# Patient Record
Sex: Female | Born: 1945
Health system: Southern US, Community
[De-identification: ages and names within clinical notes are randomized; demographics above are authoritative.]

## PROBLEM LIST (undated history)

## (undated) DIAGNOSIS — M810 Age-related osteoporosis without current pathological fracture: Secondary | ICD-10-CM

## (undated) HISTORY — DX: Age-related osteoporosis without current pathological fracture: M81.0

## (undated) HISTORY — PX: TONSILLECTOMY: SUR1361

---

## 2014-01-04 DIAGNOSIS — H3530 Unspecified macular degeneration: Secondary | ICD-10-CM | POA: Diagnosis not present

## 2014-01-04 DIAGNOSIS — H43319 Vitreous membranes and strands, unspecified eye: Secondary | ICD-10-CM | POA: Diagnosis not present

## 2014-01-04 DIAGNOSIS — D3130 Benign neoplasm of unspecified choroid: Secondary | ICD-10-CM | POA: Diagnosis not present

## 2014-05-28 DIAGNOSIS — Z23 Encounter for immunization: Secondary | ICD-10-CM | POA: Diagnosis not present

## 2015-03-26 DIAGNOSIS — R350 Frequency of micturition: Secondary | ICD-10-CM | POA: Diagnosis not present

## 2015-03-26 DIAGNOSIS — I10 Essential (primary) hypertension: Secondary | ICD-10-CM | POA: Diagnosis not present

## 2015-03-26 DIAGNOSIS — N760 Acute vaginitis: Secondary | ICD-10-CM | POA: Diagnosis not present

## 2015-03-31 ENCOUNTER — Encounter: Payer: Self-pay | Admitting: *Deleted

## 2015-03-31 ENCOUNTER — Emergency Department (INDEPENDENT_AMBULATORY_CARE_PROVIDER_SITE_OTHER)
Admission: EM | Admit: 2015-03-31 | Discharge: 2015-03-31 | Disposition: A | Discharge status: Home / Self Care | Payer: Medicare Other | Source: Home / Self Care | Attending: Family Medicine | Admitting: Family Medicine

## 2015-03-31 DIAGNOSIS — R350 Frequency of micturition: Secondary | ICD-10-CM | POA: Diagnosis not present

## 2015-03-31 DIAGNOSIS — N3090 Cystitis, unspecified without hematuria: Secondary | ICD-10-CM | POA: Diagnosis not present

## 2015-03-31 LAB — POCT URINALYSIS DIP (MANUAL ENTRY)
Glucose, UA: NEGATIVE
NITRITE UA: NEGATIVE
PH UA: 5 (ref 5–8)
Spec Grav, UA: >=1.03 (ref 1.005–1.03)
UROBILINOGEN UA: 0.2 (ref 0–1)

## 2015-03-31 MED ORDER — SULFAMETHOXAZOLE-TRIMETHOPRIM 800-160 MG PO TABS: 1.0000 | ORAL_TABLET | Freq: Two times a day (BID) | ORAL | Status: DC

## 2015-03-31 NOTE — Discharge Instructions (Signed)
Increase fluid intake.  May use non-prescription AZO for about two days, if desired, to decrease urinary discomfort.  If symptoms become significantly worse during the night or over the weekend, proceed to the local emergency room.    Urinary Tract Infection Urinary tract infections (UTIs) can develop anywhere along your urinary tract. Your urinary tract is your body's drainage system for removing wastes and extra water. Your urinary tract includes two kidneys, two ureters, a bladder, and a urethra. Your kidneys are a pair of bean-shaped organs. Each kidney is about the size of your fist. They are located below your ribs, one on each side of your spine. CAUSES Infections are caused by microbes, which are microscopic organisms, including fungi, viruses, and bacteria. These organisms are so small that they can only be seen through a microscope. Bacteria are the microbes that most commonly cause UTIs. SYMPTOMS  Symptoms of UTIs may vary by age and gender of the patient and by the location of the infection. Symptoms in young women typically include a frequent and intense urge to urinate and a painful, burning feeling in the bladder or urethra during urination. Older women and men are more likely to be tired, shaky, and weak and have muscle aches and abdominal pain. A fever may mean the infection is in your kidneys. Other symptoms of a kidney infection include pain in your back or sides below the ribs, nausea, and vomiting. DIAGNOSIS To diagnose a UTI, your caregiver will ask you about your symptoms. Your caregiver also will ask to provide a urine sample. The urine sample will be tested for bacteria and white blood cells. White blood cells are made by your body to help fight infection. TREATMENT  Typically, UTIs can be treated with medication. Because most UTIs are caused by a bacterial infection, they usually can be treated with the use of antibiotics. The choice of antibiotic and length of treatment depend  on your symptoms and the type of bacteria causing your infection. HOME CARE INSTRUCTIONS  If you were prescribed antibiotics, take them exactly as your caregiver instructs you. Finish the medication even if you feel better after you have only taken some of the medication.  Drink enough water and fluids to keep your urine clear or pale yellow.  Avoid caffeine, tea, and carbonated beverages. They tend to irritate your bladder.  Empty your bladder often. Avoid holding urine for long periods of time.  Empty your bladder before and after sexual intercourse.  After a bowel movement, women should cleanse from front to back. Use each tissue only once. SEEK MEDICAL CARE IF:   You have back pain.  You develop a fever.  Your symptoms do not begin to resolve within 3 days. SEEK IMMEDIATE MEDICAL CARE IF:   You have severe back pain or lower abdominal pain.  You develop chills.  You have nausea or vomiting.  You have continued burning or discomfort with urination. MAKE SURE YOU:   Understand these instructions.  Will watch your condition.  Will get help right away if you are not doing well or get worse. Document Released: 04/28/2005 Document Revised: 01/18/2012 Document Reviewed: 08/27/2011 [facility] Patient Information 2015 [place], [place]. This information is not intended to replace advice given to you by your health care provider. Make sure you discuss any questions you have with your health care provider.

## 2015-03-31 NOTE — ED Notes (Signed)
Pt c/o urinary frequency, pressure, and odor x 03/23/15. Was seen at [facility] 03/26/15 dx with vaginitis and given Flagyl with no relief. Denies fever.

## 2015-03-31 NOTE — ED Provider Notes (Signed)
CSN: [id number]     Arrival date & time 03/31/15  1737 History   First MD Initiated Contact with Patient 03/31/15 1759     Chief Complaint  Patient presents with  . Urinary Frequency      HPI Comments: Patient complains of onset of lower abdominal pressure and nocturia about 1.5 weeks ago.  She was seen at an urgent care clinic where a urinalysis was negative, and she was treated for a vaginitis with metronidazole.  She has had no improvement, and has now developed dysuria and hesitancy during the past two days.  No abdominal or pelvic pain.  No fevers, chills, and sweats.  No nausea/vomiting. She reports that she had take amoxicillin about 2.5 weeks ago.  Patient is a 69 y.o. female presenting with dysuria. The history is provided by the patient.  Dysuria Pain quality:  Burning Pain severity:  Mild Onset quality:  Gradual Duration:  2 days Timing:  Constant Progression:  Worsening Chronicity:  New Recent urinary tract infections: no   Relieved by:  Phenazopyridine Worsened by:  Nothing tried Ineffective treatments: Flagyl. Urinary symptoms: frequent urination and hesitancy   Urinary symptoms: no discolored urine, no foul-smelling urine, no hematuria and no bladder incontinence   Associated symptoms: no abdominal pain, no fever, no flank pain, no nausea and no vomiting   Risk factors: no recurrent urinary tract infections     History reviewed. No pertinent past medical history. Past Surgical History  Procedure Laterality Date  . Tonsillectomy     Family History  Problem Relation Age of Onset  . Kidney Stones Mother   . Cancer Father    Social History  Substance Use Topics  . Smoking status: Never Smoker   . Smokeless tobacco: None  . Alcohol Use: No   OB History    No data available     Review of Systems  Constitutional: Negative for fever.  Gastrointestinal: Negative for nausea, vomiting and abdominal pain.  Genitourinary: Positive for dysuria. Negative for flank  pain.  All other systems reviewed and are negative.   Allergies  Review of patient's allergies indicates no known allergies.  Home Medications   Prior to Admission medications   Medication Sig Start Date End Date Taking? Authorizing Provider  sulfamethoxazole-trimethoprim (BACTRIM DS,SEPTRA DS) 800-160 MG per tablet Take 1 tablet by mouth 2 (two) times daily. 03/31/15   Kandra Nicolas, MD   Meds Ordered and Administered this Visit  Medications - No data to display  BP 177/84 mmHg  Pulse 80  Temp(Src) 98 F (36.7 C) (Oral)  Resp 16  Ht 5\' 1"  (1.549 m)  Wt 108 lb (48.988 kg)  BMI 20.42 kg/m2  SpO2 97% No data found.   Physical Exam Nursing notes and Vital Signs reviewed. Appearance:  Patient appears stated age, and in no acute distress Eyes:  Pupils are equal, round, and reactive to light and accomodation.  Extraocular movement is intact.  Conjunctivae are not inflamed  Pharynx:  Normal Neck:  Supple.  No adenopathy Lungs:  Clear to auscultation.  Breath sounds are equal.  Moving air well. Heart:  Regular rate and rhythm without murmurs, rubs, or gallops.  Abdomen:  Nontender without masses or hepatosplenomegaly.  Bowel sounds are present.  No CVA or flank tenderness.  Extremities:  No edema.   Skin:  No rash present.   ED Course  Procedures  None    Labs Reviewed  POCT URINALYSIS DIP (MANUAL ENTRY) - Abnormal; Notable for the following:  Clarity, UA cloudy (*)    Bilirubin, UA small (*)    Ketones, POC UA small (15) (*)    Blood, UA trace-lysed (*)    Protein Ur, POC =100 (*)    Leukocytes, UA Trace (*)    All other components within normal limits  URINE CULTURE          MDM   1. Frequent urination   2. Cystitis    Urine culture pending Begin Bactrim DS BID for 5 to 7 days. Increase fluid intake.  May use non-prescription AZO for about two days, if desired, to decrease urinary discomfort.  If symptoms become significantly worse during the night or  over the weekend, proceed to the local emergency room.  Followup with Family Doctor if not improved in about 4 to 5 days.    Kandra Nicolas, MD 03/31/15 [phone]

## 2015-04-01 LAB — URINE CULTURE
COLONY COUNT: NO GROWTH
ORGANISM ID, BACTERIA: NO GROWTH

## 2015-04-02 ENCOUNTER — Telehealth: Payer: Self-pay | Admitting: *Deleted

## 2015-04-03 ENCOUNTER — Other Ambulatory Visit: Payer: Self-pay | Admitting: Osteopathic Medicine

## 2015-04-03 ENCOUNTER — Encounter: Payer: Self-pay | Admitting: Osteopathic Medicine

## 2015-04-03 ENCOUNTER — Other Ambulatory Visit (HOSPITAL_COMMUNITY)
Admission: RE | Admit: 2015-04-03 | Discharge: 2015-04-03 | Disposition: A | Discharge status: Home / Self Care | Payer: Medicare Other | Source: Ambulatory Visit | Attending: Osteopathic Medicine | Admitting: Osteopathic Medicine

## 2015-04-03 ENCOUNTER — Ambulatory Visit (INDEPENDENT_AMBULATORY_CARE_PROVIDER_SITE_OTHER): Payer: Medicare Other | Admitting: Osteopathic Medicine

## 2015-04-03 VITALS — BP 159/90 | HR 87 | Ht 61.0 in | Wt 109.0 lb

## 2015-04-03 DIAGNOSIS — N760 Acute vaginitis: Secondary | ICD-10-CM

## 2015-04-03 DIAGNOSIS — A499 Bacterial infection, unspecified: Secondary | ICD-10-CM

## 2015-04-03 DIAGNOSIS — Z113 Encounter for screening for infections with a predominantly sexual mode of transmission: Secondary | ICD-10-CM | POA: Insufficient documentation

## 2015-04-03 DIAGNOSIS — R351 Nocturia: Secondary | ICD-10-CM

## 2015-04-03 DIAGNOSIS — Z124 Encounter for screening for malignant neoplasm of cervix: Secondary | ICD-10-CM | POA: Insufficient documentation

## 2015-04-03 DIAGNOSIS — N9489 Other specified conditions associated with female genital organs and menstrual cycle: Secondary | ICD-10-CM

## 2015-04-03 DIAGNOSIS — R350 Frequency of micturition: Secondary | ICD-10-CM | POA: Diagnosis not present

## 2015-04-03 DIAGNOSIS — R102 Pelvic and perineal pain: Secondary | ICD-10-CM

## 2015-04-03 DIAGNOSIS — Z1151 Encounter for screening for human papillomavirus (HPV): Secondary | ICD-10-CM | POA: Insufficient documentation

## 2015-04-03 DIAGNOSIS — R030 Elevated blood-pressure reading, without diagnosis of hypertension: Secondary | ICD-10-CM

## 2015-04-03 DIAGNOSIS — B9689 Other specified bacterial agents as the cause of diseases classified elsewhere: Secondary | ICD-10-CM

## 2015-04-03 DIAGNOSIS — IMO0001 Reserved for inherently not codable concepts without codable children: Secondary | ICD-10-CM | POA: Insufficient documentation

## 2015-04-03 LAB — POCT URINALYSIS DIPSTICK
BILIRUBIN UA: NEGATIVE
Blood, UA: NEGATIVE
GLUCOSE UA: NEGATIVE
Leukocytes, UA: NEGATIVE
Nitrite, UA: POSITIVE
Urobilinogen, UA: 0.2
pH, UA: 5

## 2015-04-03 MED ORDER — OXYBUTYNIN CHLORIDE 5 MG PO TABS: 5.0000 mg | ORAL_TABLET | Freq: Three times a day (TID) | ORAL | Status: DC

## 2015-04-03 NOTE — Progress Notes (Signed)
HPI: Valerie Dixon is a 69 y.o. female who presents to [place] [place]  today for chief complaint of: UTI symptoms  . Location: suprapubic . Quality: pressure . Severity: moderate . Duration: . Timing: constant . Context: . Modifying factors: Pyridium has helped over the past few days.  . Assoc signs/symptoms: Having ot go to the bathroom more often at night, doesn't feel like she is emptying her bladder all the way. No blood in urine. No fever/flank pain. No vaginal discharge, but some odor.    Past medical, social and family history reviewed: History reviewed. No pertinent past medical history. Past Surgical History  Procedure Laterality Date  . Tonsillectomy     Social History  Substance Use Topics  . Smoking status: Never Smoker   . Smokeless tobacco: Not on file  . Alcohol Use: No   Family History  Problem Relation Age of Onset  . Kidney Stones Mother   . Cancer Father     Current Outpatient Prescriptions  Medication Sig Dispense Refill  . Calcium-Magnesium-Vitamin D (CALCIUM 500 PO) Take by mouth 2 (two) times daily.    . Cholecalciferol (VITAMIN D-3) 1000 UNITS CAPS Take by mouth.    . LUTEIN-ZEAXANTHIN PO Take by mouth.    . Magnesium 400 MG CAPS Take by mouth.    . Melatonin 3 MG CAPS Take by mouth at bedtime.    . Multiple Vitamins-Minerals (EYE VITAMINS PO) Take by mouth.    . Omega-3 Fatty Acids (OMEGA 3 PO) Take by mouth daily.    . Phenazopyridine HCl (PYRIDIUM PO) Take by mouth 2 (two) times daily.     No current facility-administered medications for this visit.   No Known Allergies   Review of Systems: CONSTITUTIONAL: Neg fever/chills, no unintentional weight changes HEAD/EYES/EARS/NOSE/THROAT: No headache/vision change or hearing change, no sore throat CARDIAC: No chest pain/pressure/palpitations, no orthopnea RESPIRATORY: No cough/shortness of breath/wheeze GASTROINTESTINAL: No nausea/vomiting/abdominal pain/blood  in stool/diarrhea/constipation MUSCULOSKELETAL: No myalgia/arthralgia GENITOURINARY: No incontinence, No abnormal genital bleeding/discharge. Urinary urgency/frequency as per HPI SKIN: No rash/wounds/concerning lesions HEM/ONC: No easy bruising/bleeding, no abnormal lymph node ENDOCRINE: No polyuria/polydipsia/polyphagia, no heat/cold intolerance  NEUROLOGIC: No weakness/dizzines/slurred speech PSYCHIATRIC: No concerns with depression/anxiety or sleep problems    Exam:  BP 159/90 mmHg  Pulse 87  Ht 5\' 1"  (1.549 m)  Wt 109 lb (49.442 kg)  BMI 20.61 kg/m2  SpO2 96% Constitutional: VSS, see above. General Appearance: alert, NAD Respiratory: Normal respiratory effort. Breath sounds normal, no wheeze/rhonchi/rales Cardiovascular: S1/S2 normal, no murmur/rub/gallop auscultated. No lower extremity edema. Gastrointestinal: Nontender, no masses. Bowel sounds normal. No hepatomegaly, no splenomegaly. No hernia appreciated. Rectal exam deferred.  Musculoskeletal: Gait normal. No clubbing/cyanosis of digits. GYN: No lesions/ulcers to external genitalia, normal urethra, normal vaginal mucosa for age, physiologic discharge, cervix normal without lesions, uterus not enlarged or tender, adnexa no masses and nontender. Atrophic changes of menopause, small introitus. Psychiatric: Normal judgment/insight. Normal mood and affect. Oriented x3.    Results for orders placed or performed during the hospital encounter of 03/31/15 (from the past 72 hour(s))  Urine culture     Status: None   Collection Time: 03/31/15  5:53 PM  Result Value Ref Range   Colony Count NO GROWTH    Organism ID, Bacteria NO GROWTH   POCT urinalysis dipstick (new)     Status: Abnormal   Collection Time: 03/31/15  6:00 PM  Result Value Ref Range   Color, UA yellow yellow   Clarity, UA  cloudy (A) clear   Glucose, UA negative negative   Bilirubin, UA small (A) negative   Ketones, POC UA small (15) (A) negative   Spec Grav, UA  >=1.030 1.005 - 1.03   Blood, UA trace-lysed (A) negative   pH, UA 5.0 5 - 8   Protein Ur, POC =100 (A) negative   Urobilinogen, UA 0.2 0 - 1   Nitrite, UA Negative Negative   Leukocytes, UA Trace (A) Negative      ASSESSMENT/PLAN:  Pelvic pressure in female - Plan: Wet prep, genital, US Pelvis Complete, GC/Chlamydia Probe Amp, POCT urinalysis dipstick  Urinary frequency - Timed voids during the day, avoid liquids before bed, avoid caffeine, trial medicine - Plan: Urinalysis, microscopic only, Wet prep, genital, oxybutynin (DITROPAN) 5 MG tablet, POCT urinalysis dipstick, Urinalysis, microscopic only  Nocturia - Plan: Urinalysis, microscopic only  Papanicolaou smear for cervical cancer screening - Pt canont recall if last Paps were normal or when these were done, will get Pap today for completeness - Plan: Cytology - PAP  White coat hypertension - Pt needs to bring home BP readings

## 2015-04-04 LAB — URINALYSIS, MICROSCOPIC ONLY
BACTERIA UA: NONE SEEN [HPF]
Casts: NONE SEEN [LPF]
Yeast: NONE SEEN [HPF]

## 2015-04-04 LAB — WET PREP, GENITAL
Trich, Wet Prep: NONE SEEN
Yeast Wet Prep HPF POC: NONE SEEN

## 2015-04-04 MED ORDER — METRONIDAZOLE 500 MG PO TABS
500.0000 mg | ORAL_TABLET | Freq: Two times a day (BID) | ORAL | Status: DC
Start: 2015-04-04 — End: 2015-04-17

## 2015-04-04 NOTE — Addendum Note (Signed)
Addended by: Maryla Morrow on: 04/04/2015 11:48 AM   Modules accepted: Orders

## 2015-04-09 ENCOUNTER — Other Ambulatory Visit: Payer: Self-pay | Admitting: Osteopathic Medicine

## 2015-04-09 DIAGNOSIS — R102 Pelvic and perineal pain: Secondary | ICD-10-CM

## 2015-04-09 LAB — CYTOLOGY - PAP

## 2015-04-09 LAB — GC/CHLAMYDIA PROBE AMP

## 2015-04-17 ENCOUNTER — Encounter: Payer: Self-pay | Admitting: Osteopathic Medicine

## 2015-04-17 ENCOUNTER — Ambulatory Visit (INDEPENDENT_AMBULATORY_CARE_PROVIDER_SITE_OTHER): Payer: Medicare Other | Admitting: Osteopathic Medicine

## 2015-04-17 VITALS — BP 150/80 | HR 93 | Ht 61.0 in | Wt 107.0 lb

## 2015-04-17 DIAGNOSIS — A499 Bacterial infection, unspecified: Secondary | ICD-10-CM

## 2015-04-17 DIAGNOSIS — R350 Frequency of micturition: Secondary | ICD-10-CM

## 2015-04-17 DIAGNOSIS — B9689 Other specified bacterial agents as the cause of diseases classified elsewhere: Secondary | ICD-10-CM

## 2015-04-17 DIAGNOSIS — R351 Nocturia: Secondary | ICD-10-CM | POA: Diagnosis not present

## 2015-04-17 DIAGNOSIS — R102 Pelvic and perineal pain: Secondary | ICD-10-CM

## 2015-04-17 DIAGNOSIS — R8299 Other abnormal findings in urine: Secondary | ICD-10-CM

## 2015-04-17 DIAGNOSIS — N9489 Other specified conditions associated with female genital organs and menstrual cycle: Secondary | ICD-10-CM

## 2015-04-17 DIAGNOSIS — N952 Postmenopausal atrophic vaginitis: Secondary | ICD-10-CM

## 2015-04-17 DIAGNOSIS — R030 Elevated blood-pressure reading, without diagnosis of hypertension: Secondary | ICD-10-CM

## 2015-04-17 DIAGNOSIS — IMO0001 Reserved for inherently not codable concepts without codable children: Secondary | ICD-10-CM

## 2015-04-17 DIAGNOSIS — N760 Acute vaginitis: Secondary | ICD-10-CM

## 2015-04-17 DIAGNOSIS — R82998 Other abnormal findings in urine: Secondary | ICD-10-CM

## 2015-04-17 MED ORDER — ESTRADIOL 0.1 MG/GM VA CREA: TOPICAL_CREAM | VAGINAL | Status: DC

## 2015-04-17 NOTE — Progress Notes (Signed)
HPI: Valerie Dixon is a 69 y.o. female who presents to [place]  today for chief complaint of: bladder pressure  . Location: suprapubic . Quality: pressure . Severity: moderate . Modifying factors: Pyridium has helped over the past few days.  She has also taken Oxybutinin as prescribed ubt only tried for a few days. Treated for BV. Other labs/cx negative and reviewed with patient, (+) crystals in urine, noHx renal stones . Assoc signs/symptoms: Having to go to the bathroom more often at night, doesn't feel like she is emptying her bladder all the way. No blood in urine. No fever/flank pain. Vaginal discharge better.   Past medical, social and family history reviewed: No past medical history on file. Past Surgical History  Procedure Laterality Date  . Tonsillectomy     Social History  Substance Use Topics  . Smoking status: Never Smoker   . Smokeless tobacco: Not on file  . Alcohol Use: No   Family History  Problem Relation Age of Onset  . Kidney Stones Mother   . Cancer Father     Current Outpatient Prescriptions  Medication Sig Dispense Refill  . Calcium-Magnesium-Vitamin D (CALCIUM 500 PO) Take by mouth 2 (two) times daily.    . Cholecalciferol (VITAMIN D-3) 1000 UNITS CAPS Take by mouth.    . LUTEIN-ZEAXANTHIN PO Take by mouth.    . Magnesium 400 MG CAPS Take by mouth.    . Melatonin 3 MG CAPS Take by mouth at bedtime.    . metroNIDAZOLE (FLAGYL) 500 MG tablet Take 1 tablet (500 mg total) by mouth 2 (two) times daily. 14 tablet 0  . Multiple Vitamins-Minerals (EYE VITAMINS PO) Take by mouth.    . Omega-3 Fatty Acids (OMEGA 3 PO) Take by mouth daily.    Marland Kitchen oxybutynin (DITROPAN) 5 MG tablet Take 1 tablet (5 mg total) by mouth 3 (three) times daily. 90 tablet 1  . Phenazopyridine HCl (PYRIDIUM PO) Take by mouth 2 (two) times daily.     No current facility-administered medications for this visit.   No Known Allergies   Review of  Systems: CONSTITUTIONAL: Neg fever/chills, no unintentional weight changes CARDIAC: No chest pain/pressure/palpitations, RESPIRATORY: No cough/shortness of breath/wheeze GASTROINTESTINAL: No nausea/vomiting/abdominal pain/blood in stool/diarrhea/constipation MUSCULOSKELETAL: No myalgia/arthralgia GENITOURINARY: No incontinence, No abnormal genital bleeding/discharge. Urinary urgency/frequency as per HPI SKIN: No rash/wounds/concerning lesions ENDOCRINE: No polyuria/polydipsia/polyphagia, no heat/cold intolerance    Exam:  There were no vitals taken for this visit. Constitutional: VSS, see above. General Appearance: alert, NAD Musculoskeletal: Gait normal. No clubbing/cyanosis of digits. Psychiatric: Normal judgment/insight. Normal mood and affect. Oriented x3.    No results found for this or any previous visit (from the past 72 hour(s)).    ASSESSMENT/PLAN:  Pelvic pressure in female - Need [place] - see below - Plan: Ambulatory referral to Urology  Urinary frequency - Plan: Ambulatory referral to Urology  Nocturia - Plan: Ambulatory referral to Urology  Crystalluria  White coat hypertension - needs to bring home BP cuff in, nurse visit for BP check  Bacterial vaginosis - treated  Vaginal atrophy - Plan: estradiol (ESTRACE VAGINAL) 0.1 MG/GM vaginal cream  Labs reviewed  Recommend longer trial of ditropan. Refer to urology for further evaluation, may need cysto or other testing.   Vaginal estrogen prescribed.    Pt states she wasn't called about [place] - we confirmed this with [place] and they had left several voicemails. Pt was asked to go down to radiology and arrange an  appointment.

## 2015-04-17 NOTE — Patient Instructions (Addendum)
Recommend stop the Cystex and Azo prior to Urology appointment. If your symptoms worsen, please return to clinic.  Recommend try the Oxybutinin for one week straight.

## 2015-04-18 ENCOUNTER — Ambulatory Visit (INDEPENDENT_AMBULATORY_CARE_PROVIDER_SITE_OTHER): Payer: Medicare Other

## 2015-04-18 ENCOUNTER — Telehealth: Payer: Self-pay | Admitting: Osteopathic Medicine

## 2015-04-18 DIAGNOSIS — N9489 Other specified conditions associated with female genital organs and menstrual cycle: Secondary | ICD-10-CM

## 2015-04-18 DIAGNOSIS — R102 Pelvic and perineal pain: Secondary | ICD-10-CM

## 2015-04-18 NOTE — Telephone Encounter (Signed)
Pt stopped by. Estradiol is too expensive and would like another med that can do the same job. Thank you.

## 2015-04-21 MED ORDER — ESTROGENS, CONJUGATED 0.625 MG/GM VA CREA: 1.0000 | TOPICAL_CREAM | Freq: Every day | VAGINAL | Status: DC

## 2015-04-21 NOTE — Telephone Encounter (Signed)
Sent Premarin cream as alternative

## 2015-05-13 DIAGNOSIS — N358 Other urethral stricture: Secondary | ICD-10-CM | POA: Diagnosis not present

## 2015-05-13 DIAGNOSIS — R3915 Urgency of urination: Secondary | ICD-10-CM | POA: Diagnosis not present

## 2015-05-13 DIAGNOSIS — R3989 Other symptoms and signs involving the genitourinary system: Secondary | ICD-10-CM | POA: Diagnosis not present

## 2015-07-08 ENCOUNTER — Telehealth: Payer: Self-pay

## 2015-07-08 DIAGNOSIS — Z1239 Encounter for other screening for malignant neoplasm of breast: Secondary | ICD-10-CM

## 2015-07-08 NOTE — Telephone Encounter (Signed)
Patient is due for a mammogram and she would like to get it done in January. Order has been put in and patient will be scheduled. Brooks Stotz,CMA

## 2015-07-14 DIAGNOSIS — R3915 Urgency of urination: Secondary | ICD-10-CM | POA: Diagnosis not present

## 2015-07-22 DIAGNOSIS — D3131 Benign neoplasm of right choroid: Secondary | ICD-10-CM | POA: Diagnosis not present

## 2015-07-22 DIAGNOSIS — H3530 Unspecified macular degeneration: Secondary | ICD-10-CM | POA: Diagnosis not present

## 2015-07-22 DIAGNOSIS — H25013 Cortical age-related cataract, bilateral: Secondary | ICD-10-CM | POA: Diagnosis not present

## 2015-07-22 DIAGNOSIS — H43312 Vitreous membranes and strands, left eye: Secondary | ICD-10-CM | POA: Diagnosis not present

## 2016-06-01 DIAGNOSIS — H52223 Regular astigmatism, bilateral: Secondary | ICD-10-CM | POA: Diagnosis not present

## 2016-06-01 DIAGNOSIS — H31421 Serous choroidal detachment, right eye: Secondary | ICD-10-CM | POA: Diagnosis not present

## 2016-06-01 DIAGNOSIS — H5203 Hypermetropia, bilateral: Secondary | ICD-10-CM | POA: Diagnosis not present

## 2016-06-01 DIAGNOSIS — H524 Presbyopia: Secondary | ICD-10-CM | POA: Diagnosis not present

## 2016-06-01 DIAGNOSIS — H2513 Age-related nuclear cataract, bilateral: Secondary | ICD-10-CM | POA: Diagnosis not present

## 2016-06-01 DIAGNOSIS — H25013 Cortical age-related cataract, bilateral: Secondary | ICD-10-CM | POA: Diagnosis not present

## 2016-06-01 DIAGNOSIS — H35721 Serous detachment of retinal pigment epithelium, right eye: Secondary | ICD-10-CM | POA: Diagnosis not present

## 2016-06-07 DIAGNOSIS — H35711 Central serous chorioretinopathy, right eye: Secondary | ICD-10-CM | POA: Diagnosis not present

## 2016-06-07 DIAGNOSIS — H43813 Vitreous degeneration, bilateral: Secondary | ICD-10-CM | POA: Diagnosis not present

## 2016-06-07 DIAGNOSIS — H35371 Puckering of macula, right eye: Secondary | ICD-10-CM | POA: Diagnosis not present

## 2016-07-05 DIAGNOSIS — H43813 Vitreous degeneration, bilateral: Secondary | ICD-10-CM | POA: Diagnosis not present

## 2016-07-05 DIAGNOSIS — H35371 Puckering of macula, right eye: Secondary | ICD-10-CM | POA: Diagnosis not present

## 2016-07-05 DIAGNOSIS — H353211 Exudative age-related macular degeneration, right eye, with active choroidal neovascularization: Secondary | ICD-10-CM | POA: Diagnosis not present

## 2016-07-05 DIAGNOSIS — H35711 Central serous chorioretinopathy, right eye: Secondary | ICD-10-CM | POA: Diagnosis not present

## 2016-07-12 DIAGNOSIS — H35721 Serous detachment of retinal pigment epithelium, right eye: Secondary | ICD-10-CM | POA: Diagnosis not present

## 2016-07-12 DIAGNOSIS — H2513 Age-related nuclear cataract, bilateral: Secondary | ICD-10-CM | POA: Diagnosis not present

## 2016-07-12 DIAGNOSIS — H31421 Serous choroidal detachment, right eye: Secondary | ICD-10-CM | POA: Diagnosis not present

## 2016-07-28 DIAGNOSIS — E11319 Type 2 diabetes mellitus with unspecified diabetic retinopathy without macular edema: Secondary | ICD-10-CM | POA: Diagnosis not present

## 2016-07-28 DIAGNOSIS — Z886 Allergy status to analgesic agent status: Secondary | ICD-10-CM | POA: Diagnosis not present

## 2016-07-28 DIAGNOSIS — H43813 Vitreous degeneration, bilateral: Secondary | ICD-10-CM | POA: Diagnosis not present

## 2016-07-28 DIAGNOSIS — H353211 Exudative age-related macular degeneration, right eye, with active choroidal neovascularization: Secondary | ICD-10-CM | POA: Diagnosis not present

## 2016-07-28 DIAGNOSIS — H35363 Drusen (degenerative) of macula, bilateral: Secondary | ICD-10-CM | POA: Diagnosis not present

## 2016-07-28 DIAGNOSIS — I10 Essential (primary) hypertension: Secondary | ICD-10-CM | POA: Diagnosis not present

## 2016-07-28 DIAGNOSIS — H353122 Nonexudative age-related macular degeneration, left eye, intermediate dry stage: Secondary | ICD-10-CM | POA: Diagnosis not present

## 2016-07-28 DIAGNOSIS — Z8673 Personal history of transient ischemic attack (TIA), and cerebral infarction without residual deficits: Secondary | ICD-10-CM | POA: Diagnosis not present

## 2016-07-28 DIAGNOSIS — H25813 Combined forms of age-related cataract, bilateral: Secondary | ICD-10-CM | POA: Diagnosis not present

## 2016-09-08 DIAGNOSIS — H35363 Drusen (degenerative) of macula, bilateral: Secondary | ICD-10-CM | POA: Diagnosis not present

## 2016-09-08 DIAGNOSIS — Z886 Allergy status to analgesic agent status: Secondary | ICD-10-CM | POA: Diagnosis not present

## 2016-09-08 DIAGNOSIS — H25813 Combined forms of age-related cataract, bilateral: Secondary | ICD-10-CM | POA: Diagnosis not present

## 2016-09-08 DIAGNOSIS — H353211 Exudative age-related macular degeneration, right eye, with active choroidal neovascularization: Secondary | ICD-10-CM | POA: Diagnosis not present

## 2016-09-08 DIAGNOSIS — H43813 Vitreous degeneration, bilateral: Secondary | ICD-10-CM | POA: Diagnosis not present

## 2016-09-08 DIAGNOSIS — H353122 Nonexudative age-related macular degeneration, left eye, intermediate dry stage: Secondary | ICD-10-CM | POA: Diagnosis not present

## 2016-11-16 DIAGNOSIS — H43813 Vitreous degeneration, bilateral: Secondary | ICD-10-CM | POA: Diagnosis not present

## 2016-11-16 DIAGNOSIS — H353122 Nonexudative age-related macular degeneration, left eye, intermediate dry stage: Secondary | ICD-10-CM | POA: Diagnosis not present

## 2016-11-16 DIAGNOSIS — H353211 Exudative age-related macular degeneration, right eye, with active choroidal neovascularization: Secondary | ICD-10-CM | POA: Diagnosis not present

## 2016-11-16 DIAGNOSIS — H25813 Combined forms of age-related cataract, bilateral: Secondary | ICD-10-CM | POA: Diagnosis not present

## 2017-01-26 DIAGNOSIS — H353122 Nonexudative age-related macular degeneration, left eye, intermediate dry stage: Secondary | ICD-10-CM | POA: Diagnosis not present

## 2017-01-26 DIAGNOSIS — H25813 Combined forms of age-related cataract, bilateral: Secondary | ICD-10-CM | POA: Diagnosis not present

## 2017-01-26 DIAGNOSIS — H353211 Exudative age-related macular degeneration, right eye, with active choroidal neovascularization: Secondary | ICD-10-CM | POA: Diagnosis not present

## 2017-01-26 DIAGNOSIS — H43813 Vitreous degeneration, bilateral: Secondary | ICD-10-CM | POA: Diagnosis not present

## 2017-03-09 ENCOUNTER — Encounter: Payer: Self-pay | Admitting: Osteopathic Medicine

## 2017-03-09 ENCOUNTER — Ambulatory Visit (INDEPENDENT_AMBULATORY_CARE_PROVIDER_SITE_OTHER): Payer: Medicare Other | Admitting: Osteopathic Medicine

## 2017-03-09 VITALS — BP 165/80 | HR 76 | Resp 18 | Wt 109.1 lb

## 2017-03-09 DIAGNOSIS — Z13220 Encounter for screening for lipoid disorders: Secondary | ICD-10-CM | POA: Diagnosis not present

## 2017-03-09 DIAGNOSIS — Z1239 Encounter for other screening for malignant neoplasm of breast: Secondary | ICD-10-CM

## 2017-03-09 DIAGNOSIS — Z1231 Encounter for screening mammogram for malignant neoplasm of breast: Secondary | ICD-10-CM

## 2017-03-09 DIAGNOSIS — Z1211 Encounter for screening for malignant neoplasm of colon: Secondary | ICD-10-CM | POA: Diagnosis not present

## 2017-03-09 DIAGNOSIS — Z780 Asymptomatic menopausal state: Secondary | ICD-10-CM | POA: Diagnosis not present

## 2017-03-09 DIAGNOSIS — I10 Essential (primary) hypertension: Secondary | ICD-10-CM

## 2017-03-09 DIAGNOSIS — R5383 Other fatigue: Secondary | ICD-10-CM

## 2017-03-09 LAB — TSH: TSH: 2.76 mIU/L

## 2017-03-09 LAB — CBC
HEMATOCRIT: 44.1 % (ref 35.0–45.0)
HEMOGLOBIN: 14.9 g/dL (ref 11.7–15.5)
MCH: 32.6 pg (ref 27.0–33.0)
MCHC: 33.8 g/dL (ref 32.0–36.0)
MCV: 96.5 fL (ref 80.0–100.0)
MPV: 9.9 fL (ref 7.5–12.5)
Platelets: 244 10*3/uL (ref 140–400)
RBC: 4.57 MIL/uL (ref 3.80–5.10)
RDW: 13.7 % (ref 11.0–15.0)
WBC: 4.8 10*3/uL (ref 3.8–10.8)

## 2017-03-09 MED ORDER — ESTROGENS, CONJUGATED 0.625 MG/GM VA CREA: 1.0000 | TOPICAL_CREAM | Freq: Every day | VAGINAL | 12 refills | Status: DC

## 2017-03-09 MED ORDER — LIDOCAINE 5 % EX OINT: 1.0000 "application " | TOPICAL_OINTMENT | Freq: Four times a day (QID) | CUTANEOUS | 1 refills | Status: DC | PRN

## 2017-03-09 NOTE — Progress Notes (Signed)
HPI: Valerie Dixon is a 71 y.o. female  who presents to [place] today, 03/09/17,  for chief complaint of:  Chief Complaint  Patient presents with  . Fatigue    Lost to follow-up for some time - last seen 04/2015 for pelvic/bladder pressure and urinary symptoms, was referred to Dr Estill Dooms for urologic workup, last seen there 07/2015  No energy. Concerned about yellowish tint to her skin But this has resolved. Has been eating a lot of Kaylee and has read that this may cause problems with thyroid. Hasn't feeling a bit sluggish for the past few months though a little bit better lately.  High blood pressure: Patient states due to stress, history of whitecoat hypertension. No chest pain, pressure, shortness of breath.    Past medical, surgical, social and family history reviewed: Patient Active Problem List   Diagnosis Date Noted  . Pelvic pressure in female 04/03/2015  . Urinary frequency 04/03/2015  . Nocturia 04/03/2015  . Papanicolaou smear for cervical cancer screening 04/03/2015  . White coat hypertension 04/03/2015   Past Surgical History:  Procedure Laterality Date  . TONSILLECTOMY     Social History  Substance Use Topics  . Smoking status: Never Smoker  . Smokeless tobacco: Not on file  . Alcohol use No   Family History  Problem Relation Age of Onset  . Kidney Stones Mother   . Cancer Father      Current medication list and allergy/intolerance information reviewed:   Current Outpatient Prescriptions  Medication Sig Dispense Refill  . Calcium-Magnesium-Vitamin D (CALCIUM 500 PO) Take by mouth 2 (two) times daily.    . Cholecalciferol (VITAMIN D-3) 1000 UNITS CAPS Take by mouth.    . conjugated estrogens (PREMARIN) vaginal cream Place 1 Applicatorful vaginally daily. Use daily x 2 weeks, then use 3 days per week x 2 weeks, then use once per week 42.5 g 12  . LUTEIN-ZEAXANTHIN PO Take by mouth.    . Magnesium 400 MG CAPS Take by  mouth.    . Melatonin 3 MG CAPS Take by mouth at bedtime.    . Methenamine-Sodium Salicylate (CYSTEX PO) Take by mouth.    . Misc Natural Products (PUMPKIN SEED OIL PO) Take by mouth.    . Multiple Vitamins-Minerals (EYE VITAMINS PO) Take by mouth.    . Omega-3 Fatty Acids (OMEGA 3 PO) Take by mouth daily.    Marland Kitchen oxybutynin (DITROPAN) 5 MG tablet Take 1 tablet (5 mg total) by mouth 3 (three) times daily. (Patient not taking: Reported on 04/17/2015) 90 tablet 1  . Phenazopyridine HCl (AZO TABS PO) Take by mouth.    . Probiotic Product (PROBIOTIC DAILY PO) Take by mouth.     No current facility-administered medications for this visit.    No Known Allergies    Review of Systems:  Constitutional:  No  fever, no chills, No recent illness, No unintentional weight changes. + significant fatigue.   HEENT: No  headache, no vision change  Cardiac: No  chest pain, No  pressure, No palpitations  Respiratory:  No  shortness of breath. No  Cough  Gastrointestinal: No  abdominal pain, No  nausea, No  vomiting,  No  blood in stool, No  diarrhea, No  constipation   Musculoskeletal: No new myalgia/arthralgia  Skin: No  Rash, No other wounds/concerning lesions  Hem/Onc: No  easy bruising/bleeding  Endocrine: No cold intolerance,  No heat intolerance. No polyuria/polydipsia/polyphagia   Neurologic: No  weakness, No  dizziness  Psychiatric: No  concerns with depression, No  concerns with anxiety, No sleep problems, No mood problems  Exam:  BP (!) 165/80   Pulse 76   Resp 18   Wt 109 lb 1.6 oz (49.5 kg)   BMI 20.61 kg/m   Constitutional: VS see above. General Appearance: alert, well-developed, well-nourished, NAD  Eyes: Normal lids and conjunctive, non-icteric sclera  Ears, Nose, Mouth, Throat: MMM, Normal external inspection ears/nares/mouth/lips/gums.   Neck: No masses, trachea midline. No thyroid enlargement. No tenderness/mass appreciated. No lymphadenopathy  Respiratory: Normal  respiratory effort. no wheeze, no rhonchi, no rales  Cardiovascular: S1/S2 normal, no murmur, no rub/gallop auscultated. RRR. No lower extremity edema.  Musculoskeletal: Gait normal. No clubbing/cyanosis of digits.   Neurological: Normal balance/coordination. No tremor.   Skin: warm, dry, intact. No rash/ulcer.    Psychiatric: Normal judgment/insight. Normal mood and affect. Oriented x3.    Results for orders placed or performed in visit on 03/09/17 (from the past 72 hour(s))  CBC     Status: None   Collection Time: 03/09/17 11:45 AM  Result Value Ref Range   WBC 4.8 3.8 - 10.8 K/uL   RBC 4.57 3.80 - 5.10 MIL/uL   Hemoglobin 14.9 11.7 - 15.5 g/dL   HCT 44.1 35.0 - 45.0 %   MCV 96.5 80.0 - 100.0 fL   MCH 32.6 27.0 - 33.0 pg   MCHC 33.8 32.0 - 36.0 g/dL   RDW 13.7 11.0 - 15.0 %   Platelets 244 140 - 400 K/uL   MPV 9.9 7.5 - 12.5 fL  COMPLETE METABOLIC PANEL WITH GFR     Status: Abnormal   Collection Time: 03/09/17 11:45 AM  Result Value Ref Range   Sodium 137 135 - 146 mmol/L   Potassium 4.2 3.5 - 5.3 mmol/L   Chloride 102 98 - 110 mmol/L   CO2 22 20 - 32 mmol/L    Comment: ** Please note change in reference range(s). **      Glucose, Bld 100 (H) 65 - 99 mg/dL   BUN 18 7 - 25 mg/dL   Creat 0.67 0.50 - 0.99 mg/dL    Comment:   For patients > or = 69 years of age: The upper reference limit for Creatinine is approximately 13% higher for people identified as African-American.      Total Bilirubin 0.8 0.2 - 1.2 mg/dL   Alkaline Phosphatase 71 33 - 130 U/L   AST 23 10 - 35 U/L   ALT 20 6 - 29 U/L   Total Protein 6.8 6.1 - 8.1 g/dL   Albumin 4.5 3.6 - 5.1 g/dL   Calcium 9.6 8.6 - 10.4 mg/dL   GFR, Est African American >89 >=60 mL/min   GFR, Est Non African American >89 >=60 mL/min  Lipid panel     Status: Abnormal   Collection Time: 03/09/17 11:45 AM  Result Value Ref Range   Cholesterol 245 (H) <200 mg/dL   Triglycerides 75 <150 mg/dL   HDL 105 >50 mg/dL   Total  CHOL/HDL Ratio 2.3 <5.0 Ratio   VLDL 15 <30 mg/dL   LDL Cholesterol 125 (H) <100 mg/dL  TSH     Status: None   Collection Time: 03/09/17 11:45 AM  Result Value Ref Range   TSH 2.76 mIU/L    Comment:   Reference Range   > or = 20 Years  0.40-4.50   Pregnancy Range First trimester  0.26-2.66 Second trimester 0.55-2.73 Third trimester  0.43-2.91  Hepatitis C antibody     Status: None   Collection Time: 03/09/17 11:45 AM  Result Value Ref Range   HCV Ab NON-REACTIVE NON-REACTIVE    Comment:                                                                        This test is for screening purposes only.  Reactive results should be confirmed by an alternative method.  Suggest HCV Qualitative, PCR, test code 83130.  Specimens will be stable for reflex testing up to 3 days after collection.   VITAMIN D 25 Hydroxy (Vit-D Deficiency, Fractures)     Status: None   Collection Time: 03/09/17 11:45 AM  Result Value Ref Range   Vit D, 25-Hydroxy 56 30 - 100 ng/mL    Comment: Vitamin D Status           25-OH Vitamin D        Deficiency                <20 ng/mL        Insufficiency         20 - 29 ng/mL        Optimal             > or = 30 ng/mL   For 25-OH Vitamin D testing on patients on D2-supplementation and patients for whom quantitation of D2 and D3 fractions is required, the QuestAssureD 25-OH VIT D, (D2,D3), LC/MS/MS is recommended: order code [id number] (patients > 2 yrs).   Urinalysis, Routine w reflex microscopic     Status: None   Collection Time: 03/09/17 11:45 AM  Result Value Ref Range   Color, Urine YELLOW YELLOW   APPearance CLEAR CLEAR   Specific Gravity, Urine 1.009 1.001 - 1.035   pH 5.5 5.0 - 8.0   Glucose, UA NEGATIVE NEGATIVE   Bilirubin Urine NEGATIVE NEGATIVE   Ketones, ur NEGATIVE NEGATIVE   Hgb urine dipstick NEGATIVE NEGATIVE   Protein, ur NEGATIVE NEGATIVE   Nitrite NEGATIVE NEGATIVE   Leukocytes, UA NEGATIVE NEGATIVE    ASSESSMENT/PLAN: No concerns  on labs. Advise recheck blood pressure and fatigue at upcoming annual physical.   Fatigue, unspecified type - Plan: CBC, COMPLETE METABOLIC PANEL WITH GFR, Lipid panel, TSH, Hepatitis C antibody, VITAMIN D 25 Hydroxy (Vit-D Deficiency, Fractures), Urinalysis, Routine w reflex microscopic  Breast cancer screening - Plan: MM DIGITAL SCREENING BILATERAL  Colon cancer screening - Plan: Cologuard  Postmenopausal - Plan: VITAMIN D 25 Hydroxy (Vit-D Deficiency, Fractures), DG Bone Density  Lipid screening - Plan: Lipid panel  Hypertension, unspecified type      Visit summary with medication list and pertinent instructions was printed for patient to review. All questions at time of visit were answered - patient instructed to contact office with any additional concerns. ER/RTC precautions were reviewed with the patient. Follow-up plan: Return in about 4 weeks (around 04/06/2017) for annual check-up, sooner if needed .  Note: Total time spent 25 minutes, greater than 50% of the visit was spent face-to-face counseling and coordinating care for the following: The primary encounter diagnosis was Fatigue, unspecified type. Diagnoses of Breast cancer screening, Colon cancer screening, Postmenopausal, Lipid screening, and Hypertension, unspecified type were  also pertinent to this visit.Marland Kitchen

## 2017-03-10 LAB — URINALYSIS, ROUTINE W REFLEX MICROSCOPIC
BILIRUBIN URINE: NEGATIVE
Glucose, UA: NEGATIVE
HGB URINE DIPSTICK: NEGATIVE
Ketones, ur: NEGATIVE
LEUKOCYTES UA: NEGATIVE
Nitrite: NEGATIVE
PROTEIN: NEGATIVE
Specific Gravity, Urine: 1.009 (ref 1.001–1.035)
pH: 5.5 (ref 5.0–8.0)

## 2017-03-10 LAB — COMPLETE METABOLIC PANEL WITH GFR
ALBUMIN: 4.5 g/dL (ref 3.6–5.1)
ALK PHOS: 71 U/L (ref 33–130)
ALT: 20 U/L (ref 6–29)
AST: 23 U/L (ref 10–35)
BILIRUBIN TOTAL: 0.8 mg/dL (ref 0.2–1.2)
BUN: 18 mg/dL (ref 7–25)
CALCIUM: 9.6 mg/dL (ref 8.6–10.4)
CO2: 22 mmol/L (ref 20–32)
CREATININE: 0.67 mg/dL (ref 0.50–0.99)
Chloride: 102 mmol/L (ref 98–110)
GFR, Est Non African American: >89 mL/min (ref >=60)
Glucose, Bld: 100 mg/dL — ABNORMAL HIGH (ref 65–99)
Potassium: 4.2 mmol/L (ref 3.5–5.3)
Sodium: 137 mmol/L (ref 135–146)
TOTAL PROTEIN: 6.8 g/dL (ref 6.1–8.1)

## 2017-03-10 LAB — LIPID PANEL
Cholesterol: 245 mg/dL — ABNORMAL HIGH (ref <200)
HDL: 105 mg/dL (ref >50)
LDL Cholesterol: 125 mg/dL — ABNORMAL HIGH (ref <100)
TRIGLYCERIDES: 75 mg/dL (ref <150)
Total CHOL/HDL Ratio: 2.3 Ratio (ref <5.0)
VLDL: 15 mg/dL (ref <30)

## 2017-03-10 LAB — VITAMIN D 25 HYDROXY (VIT D DEFICIENCY, FRACTURES): VIT D 25 HYDROXY: 56 ng/mL (ref 30–100)

## 2017-03-10 LAB — HEPATITIS C ANTIBODY: HCV Ab: NONREACTIVE

## 2017-03-16 ENCOUNTER — Ambulatory Visit (INDEPENDENT_AMBULATORY_CARE_PROVIDER_SITE_OTHER): Payer: Medicare Other

## 2017-03-16 DIAGNOSIS — M810 Age-related osteoporosis without current pathological fracture: Secondary | ICD-10-CM

## 2017-03-16 DIAGNOSIS — R928 Other abnormal and inconclusive findings on diagnostic imaging of breast: Secondary | ICD-10-CM | POA: Diagnosis not present

## 2017-03-16 DIAGNOSIS — Z1231 Encounter for screening mammogram for malignant neoplasm of breast: Secondary | ICD-10-CM | POA: Diagnosis not present

## 2017-03-16 DIAGNOSIS — M8588 Other specified disorders of bone density and structure, other site: Secondary | ICD-10-CM | POA: Diagnosis not present

## 2017-03-16 DIAGNOSIS — Z780 Asymptomatic menopausal state: Secondary | ICD-10-CM | POA: Diagnosis not present

## 2017-03-18 ENCOUNTER — Other Ambulatory Visit: Payer: Self-pay | Admitting: Osteopathic Medicine

## 2017-03-18 DIAGNOSIS — R928 Other abnormal and inconclusive findings on diagnostic imaging of breast: Secondary | ICD-10-CM

## 2017-03-22 DIAGNOSIS — Z1212 Encounter for screening for malignant neoplasm of rectum: Secondary | ICD-10-CM | POA: Diagnosis not present

## 2017-03-22 DIAGNOSIS — Z1211 Encounter for screening for malignant neoplasm of colon: Secondary | ICD-10-CM | POA: Diagnosis not present

## 2017-03-23 ENCOUNTER — Encounter: Payer: Self-pay | Admitting: Osteopathic Medicine

## 2017-03-23 DIAGNOSIS — M810 Age-related osteoporosis without current pathological fracture: Secondary | ICD-10-CM

## 2017-03-23 HISTORY — DX: Age-related osteoporosis without current pathological fracture: M81.0

## 2017-03-23 LAB — COLOGUARD: COLOGUARD: NEGATIVE

## 2017-03-24 ENCOUNTER — Ambulatory Visit
Admission: RE | Admit: 2017-03-24 | Discharge: 2017-03-24 | Disposition: A | Discharge status: Home / Self Care | Payer: Medicare Other | Source: Ambulatory Visit | Attending: Osteopathic Medicine | Admitting: Osteopathic Medicine

## 2017-03-24 ENCOUNTER — Other Ambulatory Visit: Payer: Self-pay | Admitting: Osteopathic Medicine

## 2017-03-24 ENCOUNTER — Ambulatory Visit: Payer: Medicare Other

## 2017-03-24 DIAGNOSIS — R928 Other abnormal and inconclusive findings on diagnostic imaging of breast: Secondary | ICD-10-CM

## 2017-03-24 DIAGNOSIS — R921 Mammographic calcification found on diagnostic imaging of breast: Secondary | ICD-10-CM

## 2017-03-24 DIAGNOSIS — R922 Inconclusive mammogram: Secondary | ICD-10-CM | POA: Diagnosis not present

## 2017-04-06 ENCOUNTER — Encounter: Payer: Self-pay | Admitting: Osteopathic Medicine

## 2017-04-06 ENCOUNTER — Ambulatory Visit (INDEPENDENT_AMBULATORY_CARE_PROVIDER_SITE_OTHER): Payer: Medicare Other | Admitting: Osteopathic Medicine

## 2017-04-06 VITALS — BP 174/64 | HR 79 | Ht 61.0 in | Wt 109.0 lb

## 2017-04-06 DIAGNOSIS — Z23 Encounter for immunization: Secondary | ICD-10-CM

## 2017-04-06 DIAGNOSIS — R5383 Other fatigue: Secondary | ICD-10-CM

## 2017-04-06 DIAGNOSIS — R030 Elevated blood-pressure reading, without diagnosis of hypertension: Secondary | ICD-10-CM | POA: Diagnosis not present

## 2017-04-06 DIAGNOSIS — M810 Age-related osteoporosis without current pathological fracture: Secondary | ICD-10-CM | POA: Diagnosis not present

## 2017-04-06 NOTE — Progress Notes (Signed)
HPI: Valerie Dixon is a 71 y.o. female  who presents to [place] today, 04/06/17,  for chief complaint of:  Chief Complaint  Patient presents with  . Follow-up    fatigue/ lab work form a month ago    About a month ago we saw patient for fatigue issues. No alarm symptoms at that time, fatigue is a better. No major concerns on labs, see below for results which were discussed in detail with the patient.  Bone density testing also demonstrated osteoporosis the patient does not want to start any medications at this point even given increased risk of fracture.  White coat hypertension: Patient has verified home blood pressure cuff which is measuring numbers systolic typically in the 878M to 767M, diastolic in the 09O to 70J. No chest pain, pressure, shortness of breath   Past medical history, surgical history, social history and family history reviewed.  Patient Active Problem List   Diagnosis Date Noted  . Osteoporosis 03/23/2017  . Pelvic pressure in female 04/03/2015  . Urinary frequency 04/03/2015  . Nocturia 04/03/2015  . Papanicolaou smear for cervical cancer screening 04/03/2015  . White coat hypertension 04/03/2015    Current medication list and allergy/intolerance information reviewed.   Current Outpatient Prescriptions on File Prior to Visit  Medication Sig Dispense Refill  . Calcium-Magnesium-Vitamin D (CALCIUM 500 PO) Take by mouth 2 (two) times daily.    . Cholecalciferol (VITAMIN D-3) 1000 UNITS CAPS Take by mouth.    . conjugated estrogens (PREMARIN) vaginal cream Place 1 Applicatorful vaginally daily. Use daily x 2 weeks, then use 3 days per week x 2 weeks, then use once per week 42.5 g 12  . LUTEIN-ZEAXANTHIN PO Take by mouth.    . Magnesium 400 MG CAPS Take by mouth.    . Melatonin 3 MG CAPS Take by mouth at bedtime.    . Methenamine-Sodium Salicylate (CYSTEX PO) Take by mouth.    . Misc Natural Products (PUMPKIN SEED OIL PO)  Take by mouth.    . Multiple Vitamins-Minerals (EYE VITAMINS PO) Take by mouth.    . Omega-3 Fatty Acids (OMEGA 3 PO) Take by mouth daily.    Marland Kitchen oxybutynin (DITROPAN) 5 MG tablet Take 1 tablet (5 mg total) by mouth 3 (three) times daily. 90 tablet 1  . Phenazopyridine HCl (AZO TABS PO) Take by mouth.    . Probiotic Product (PROBIOTIC DAILY PO) Take by mouth.     No current facility-administered medications on file prior to visit.    No Known Allergies    Review of Systems:  Constitutional: No recent illness, +fatigue  HEENT: No  headache, no vision change  Cardiac: No  chest pain, No  pressure, No palpitations  Respiratory:  No  shortness of breath. No  Cough  Gastrointestinal: No  abdominal pain, no change on bowel habits  Musculoskeletal: No new myalgia/arthralgia  Skin: No  Rash  Hem/Onc: No  easy bruising/bleeding, No  abnormal lumps/bumps  Neurologic: No  weakness, No  Dizziness  Psychiatric: No  concerns with depression, No  concerns with anxiety  Exam:  BP (!) 174/64   Pulse 79   Ht 5\' 1"  (1.549 m)   Wt 109 lb (49.4 kg)   BMI 20.60 kg/m   Constitutional: VS see above. General Appearance: alert, well-developed, well-nourished, NAD  Eyes: Normal lids and conjunctive, non-icteric sclera  Ears, Nose, Mouth, Throat: MMM, Normal external inspection ears/nares/mouth/lips/gums.  Neck: No masses, trachea midline.  Respiratory: Normal respiratory effort. no wheeze, no rhonchi, no rales  Cardiovascular: S1/S2 normal, no murmur, no rub/gallop auscultated. RRR.   Musculoskeletal: Gait normal. Symmetric and independent movement of all extremities  Neurological: Normal balance/coordination. No tremor.  Skin: warm, dry, intact.   Psychiatric: Normal judgment/insight. Normal mood and affect. Oriented x3.    Recent Results (from the past 2160 hour(s))  CBC     Status: None   Collection Time: 03/09/17 11:45 AM  Result Value Ref Range   WBC 4.8 3.8 - 10.8 K/uL    RBC 4.57 3.80 - 5.10 MIL/uL   Hemoglobin 14.9 11.7 - 15.5 g/dL   HCT 44.1 35.0 - 45.0 %   MCV 96.5 80.0 - 100.0 fL   MCH 32.6 27.0 - 33.0 pg   MCHC 33.8 32.0 - 36.0 g/dL   RDW 13.7 11.0 - 15.0 %   Platelets 244 140 - 400 K/uL   MPV 9.9 7.5 - 12.5 fL  COMPLETE METABOLIC PANEL WITH GFR     Status: Abnormal   Collection Time: 03/09/17 11:45 AM  Result Value Ref Range   Sodium 137 135 - 146 mmol/L   Potassium 4.2 3.5 - 5.3 mmol/L   Chloride 102 98 - 110 mmol/L   CO2 22 20 - 32 mmol/L    Comment: ** Please note change in reference range(s). **      Glucose, Bld 100 (H) 65 - 99 mg/dL   BUN 18 7 - 25 mg/dL   Creat 0.67 0.50 - 0.99 mg/dL    Comment:   For patients > or = 69 years of age: The upper reference limit for Creatinine is approximately 13% higher for people identified as African-American.      Total Bilirubin 0.8 0.2 - 1.2 mg/dL   Alkaline Phosphatase 71 33 - 130 U/L   AST 23 10 - 35 U/L   ALT 20 6 - 29 U/L   Total Protein 6.8 6.1 - 8.1 g/dL   Albumin 4.5 3.6 - 5.1 g/dL   Calcium 9.6 8.6 - 10.4 mg/dL   GFR, Est African American >89 >=60 mL/min   GFR, Est Non African American >89 >=60 mL/min  Lipid panel     Status: Abnormal   Collection Time: 03/09/17 11:45 AM  Result Value Ref Range   Cholesterol 245 (H) <200 mg/dL   Triglycerides 75 <150 mg/dL   HDL 105 >50 mg/dL   Total CHOL/HDL Ratio 2.3 <5.0 Ratio   VLDL 15 <30 mg/dL   LDL Cholesterol 125 (H) <100 mg/dL  TSH     Status: None   Collection Time: 03/09/17 11:45 AM  Result Value Ref Range   TSH 2.76 mIU/L    Comment:   Reference Range   > or = 20 Years  0.40-4.50   Pregnancy Range First trimester  0.26-2.66 Second trimester 0.55-2.73 Third trimester  0.43-2.91     Hepatitis C antibody     Status: None   Collection Time: 03/09/17 11:45 AM  Result Value Ref Range   HCV Ab NON-REACTIVE NON-REACTIVE    Comment:                                                                        This test  is for  screening purposes only.  Reactive results should be confirmed by an alternative method.  Suggest HCV Qualitative, PCR, test code 83130.  Specimens will be stable for reflex testing up to 3 days after collection.   VITAMIN D 25 Hydroxy (Vit-D Deficiency, Fractures)     Status: None   Collection Time: 03/09/17 11:45 AM  Result Value Ref Range   Vit D, 25-Hydroxy 56 30 - 100 ng/mL    Comment: Vitamin D Status           25-OH Vitamin D        Deficiency                <20 ng/mL        Insufficiency         20 - 29 ng/mL        Optimal             > or = 30 ng/mL   For 25-OH Vitamin D testing on patients on D2-supplementation and patients for whom quantitation of D2 and D3 fractions is required, the QuestAss[facility]D 25-OH VIT D, (D2,D3), LC/MS/MS is recommended: order code [id number] (patients > 2 yrs).   Urinalysis, Routine w reflex microscopic     Status: None   Collection Time: 03/09/17 11:45 AM  Result Value Ref Range   Color, Urine YELLOW YELLOW   APPearance CLEAR CLEAR   Specific Gravity, Urine 1.009 1.001 - 1.035   pH 5.5 5.0 - 8.0   Glucose, UA NEGATIVE NEGATIVE   Bilirubin Urine NEGATIVE NEGATIVE   Ketones, ur NEGATIVE NEGATIVE   Hgb urine dipstick NEGATIVE NEGATIVE   Protein, ur NEGATIVE NEGATIVE   Nitrite NEGATIVE NEGATIVE   Leukocytes, UA NEGATIVE NEGATIVE     Depression screen [facility] 2/9 04/06/2017  Decreased Interest 0  Down, Depressed, Hopeless 0  PHQ - 2 Score 0      ASSESSMENT/PLAN:   No alarm symptoms for concerning for serious disease, consider repeat labs if nothing changes. Optimize diet/exercise  Patient declines treatment for osteoporosis  Fatigue, unspecified type  Need for 23-polyvalent pneumococcal polysaccharide vaccine - Plan: Pneumococcal polysaccharide vaccine 23-valent greater than or equal to 2yo subcutaneous/IM  Age-related osteoporosis without current pathological fracture  White coat syndrome without hypertension    Patient Instructions    Osteoporosis Osteoporosis is the thinning and loss of density in the bones. Osteoporosis makes the bones more brittle, fragile, and likely to break (fracture). Over time, osteoporosis can cause the bones to become so weak that they fracture after a simple fall. The bones most likely to fracture are the bones in the hip, wrist, and spine. What are the causes? The exact cause is not known. What increases the risk? Anyone can develop osteoporosis. You may be at greater risk if you have a family history of the condition or have poor nutrition. You may also have a higher risk if you are:  Female.  78 years old or older.  A smoker.  Not physically active.  White or Asian.  Slender.  What are the signs or symptoms? A fracture might be the first sign of the disease, especially if it results from a fall or injury that would not usually cause a bone to break. Other signs and symptoms include:  Low back and neck pain.  Stooped posture.  Height loss.  How is this diagnosed? To make a diagnosis, your health care provider may:  Take a medical history.  Perform a physical exam.  Order  tests, such as: ? A bone mineral density test. ? A dual-energy X-ray absorptiometry test.  How is this treated? The goal of osteoporosis treatment is to strengthen your bones to reduce your risk of a fracture. Treatment may involve:  Making lifestyle changes, such as: ? Eating a diet rich in calcium. ? Doing weight-bearing and muscle-strengthening exercises. ? Stopping tobacco use. ? Limiting alcohol intake.  Taking medicine to slow the process of bone loss or to increase bone density.  Monitoring your levels of calcium and vitamin D.  Follow these instructions at home:  Include calcium and vitamin D in your diet. Calcium is important for bone health, and vitamin D helps the body absorb calcium.  Perform weight-bearing and muscle-strengthening exercises as directed by your health care  provider.  Do not use any tobacco products, including cigarettes, chewing tobacco, and electronic cigarettes. If you need help quitting, ask your health care provider.  Limit your alcohol intake.  Take medicines only as directed by your health care provider.  Keep all follow-up visits as directed by your health care provider. This is important.  Take precautions at home to lower your risk of falling, such as: ? Keeping rooms well lit and clutter free. ? Installing safety rails on stairs. ? Using rubber mats in the bathroom and other areas that are often wet or slippery. Get help right away if: You fall or injure yourself. This information is not intended to replace advice given to you by your health care provider. Make sure you discuss any questions you have with your health care provider. Document Released: 04/28/2005 Document Revised: 12/22/2015 Document Reviewed: 12/27/2013 Elsevier Interactive Patient Education  2017 [employer].     Follow-up plan: Return in about 1 year (around 04/06/2018) for [facility] PHYSICAL, sooner if needed.  Visit summary with medication list and pertinent instructions was printed for patient to review, alert [place] if any changes needed. All questions at time of visit were answered - patient instructed to contact office with any additional concerns. ER/RTC precautions were reviewed with the patient and understanding verbalized.   Note: Total time spent 25 minutes, greater than 50% of the visit was spent face-to-face counseling and coordinating care for the following: The primary encounter diagnosis was Fatigue, unspecified type. Diagnoses of Need for 23-polyvalent pneumococcal polysaccharide vaccine, Age-related osteoporosis without current pathological fracture, and White coat syndrome without hypertension were also pertinent to this visit.Marland Kitchen

## 2017-04-06 NOTE — Patient Instructions (Signed)

## 2017-04-08 ENCOUNTER — Telehealth: Payer: Self-pay | Admitting: Osteopathic Medicine

## 2017-04-08 NOTE — Telephone Encounter (Signed)
Please call patient: I have received her results from cologuard, testing was negative, plan to repeat in 3 years  Report sent to scan. Please update health maintenance thanks

## 2017-04-08 NOTE — Telephone Encounter (Signed)
Left message on patient vm with results. It will be updated in patient charge by someone in Triage once they scan it it. Rhonda Cunningham,CMA

## 2017-04-13 ENCOUNTER — Encounter: Payer: Self-pay | Admitting: Osteopathic Medicine

## 2017-04-13 LAB — COLOGUARD

## 2017-05-04 DIAGNOSIS — H353211 Exudative age-related macular degeneration, right eye, with active choroidal neovascularization: Secondary | ICD-10-CM | POA: Diagnosis not present

## 2017-05-04 DIAGNOSIS — H353122 Nonexudative age-related macular degeneration, left eye, intermediate dry stage: Secondary | ICD-10-CM | POA: Diagnosis not present

## 2017-05-04 DIAGNOSIS — H25813 Combined forms of age-related cataract, bilateral: Secondary | ICD-10-CM | POA: Diagnosis not present

## 2017-05-04 DIAGNOSIS — I739 Peripheral vascular disease, unspecified: Secondary | ICD-10-CM | POA: Diagnosis not present

## 2017-05-04 DIAGNOSIS — H43813 Vitreous degeneration, bilateral: Secondary | ICD-10-CM | POA: Diagnosis not present

## 2017-05-27 ENCOUNTER — Telehealth: Payer: Self-pay

## 2017-05-27 NOTE — Telephone Encounter (Signed)
Pt called and stated that she had some lab work done on 03/09/17. Pt stated that medicare is not covering it due to coding being not medically necessary. Please Advise?

## 2017-07-18 DIAGNOSIS — H2513 Age-related nuclear cataract, bilateral: Secondary | ICD-10-CM | POA: Diagnosis not present

## 2017-07-18 DIAGNOSIS — H25013 Cortical age-related cataract, bilateral: Secondary | ICD-10-CM | POA: Diagnosis not present

## 2017-07-18 DIAGNOSIS — H31421 Serous choroidal detachment, right eye: Secondary | ICD-10-CM | POA: Diagnosis not present

## 2017-09-07 DIAGNOSIS — H353122 Nonexudative age-related macular degeneration, left eye, intermediate dry stage: Secondary | ICD-10-CM | POA: Diagnosis not present

## 2017-09-07 DIAGNOSIS — H43813 Vitreous degeneration, bilateral: Secondary | ICD-10-CM | POA: Diagnosis not present

## 2017-09-07 DIAGNOSIS — H35721 Serous detachment of retinal pigment epithelium, right eye: Secondary | ICD-10-CM | POA: Diagnosis not present

## 2017-09-07 DIAGNOSIS — H35363 Drusen (degenerative) of macula, bilateral: Secondary | ICD-10-CM | POA: Diagnosis not present

## 2017-09-07 DIAGNOSIS — H353211 Exudative age-related macular degeneration, right eye, with active choroidal neovascularization: Secondary | ICD-10-CM | POA: Diagnosis not present

## 2017-09-07 DIAGNOSIS — H25813 Combined forms of age-related cataract, bilateral: Secondary | ICD-10-CM | POA: Diagnosis not present

## 2017-09-26 ENCOUNTER — Ambulatory Visit
Admission: RE | Admit: 2017-09-26 | Discharge: 2017-09-26 | Disposition: A | Discharge status: Home / Self Care | Payer: Medicare Other | Source: Ambulatory Visit | Attending: Osteopathic Medicine | Admitting: Osteopathic Medicine

## 2017-09-26 ENCOUNTER — Other Ambulatory Visit: Payer: Self-pay | Admitting: Osteopathic Medicine

## 2017-09-26 DIAGNOSIS — R921 Mammographic calcification found on diagnostic imaging of breast: Secondary | ICD-10-CM

## 2017-09-27 ENCOUNTER — Other Ambulatory Visit: Payer: Self-pay | Admitting: Osteopathic Medicine

## 2017-10-12 DIAGNOSIS — H353211 Exudative age-related macular degeneration, right eye, with active choroidal neovascularization: Secondary | ICD-10-CM | POA: Diagnosis not present

## 2017-10-12 DIAGNOSIS — H25813 Combined forms of age-related cataract, bilateral: Secondary | ICD-10-CM | POA: Diagnosis not present

## 2017-10-12 DIAGNOSIS — H353122 Nonexudative age-related macular degeneration, left eye, intermediate dry stage: Secondary | ICD-10-CM | POA: Diagnosis not present

## 2017-10-12 DIAGNOSIS — H43813 Vitreous degeneration, bilateral: Secondary | ICD-10-CM | POA: Diagnosis not present

## 2017-10-18 DIAGNOSIS — R300 Dysuria: Secondary | ICD-10-CM | POA: Diagnosis not present

## 2017-10-18 DIAGNOSIS — S76012A Strain of muscle, fascia and tendon of left hip, initial encounter: Secondary | ICD-10-CM | POA: Diagnosis not present

## 2017-11-03 DIAGNOSIS — I83811 Varicose veins of right lower extremities with pain: Secondary | ICD-10-CM | POA: Diagnosis not present

## 2017-11-03 DIAGNOSIS — I8393 Asymptomatic varicose veins of bilateral lower extremities: Secondary | ICD-10-CM | POA: Diagnosis not present

## 2017-11-03 DIAGNOSIS — I872 Venous insufficiency (chronic) (peripheral): Secondary | ICD-10-CM | POA: Diagnosis not present

## 2017-11-18 DIAGNOSIS — H353122 Nonexudative age-related macular degeneration, left eye, intermediate dry stage: Secondary | ICD-10-CM | POA: Diagnosis not present

## 2017-11-18 DIAGNOSIS — H43813 Vitreous degeneration, bilateral: Secondary | ICD-10-CM | POA: Diagnosis not present

## 2017-11-18 DIAGNOSIS — H353211 Exudative age-related macular degeneration, right eye, with active choroidal neovascularization: Secondary | ICD-10-CM | POA: Diagnosis not present

## 2017-11-18 DIAGNOSIS — H25813 Combined forms of age-related cataract, bilateral: Secondary | ICD-10-CM | POA: Diagnosis not present

## 2017-12-06 DIAGNOSIS — I872 Venous insufficiency (chronic) (peripheral): Secondary | ICD-10-CM | POA: Diagnosis not present

## 2017-12-06 DIAGNOSIS — I8393 Asymptomatic varicose veins of bilateral lower extremities: Secondary | ICD-10-CM | POA: Diagnosis not present

## 2018-01-12 DIAGNOSIS — I83811 Varicose veins of right lower extremities with pain: Secondary | ICD-10-CM | POA: Diagnosis not present

## 2018-01-12 DIAGNOSIS — I8393 Asymptomatic varicose veins of bilateral lower extremities: Secondary | ICD-10-CM | POA: Diagnosis not present

## 2018-01-12 DIAGNOSIS — I872 Venous insufficiency (chronic) (peripheral): Secondary | ICD-10-CM | POA: Diagnosis not present

## 2018-01-16 DIAGNOSIS — H353131 Nonexudative age-related macular degeneration, bilateral, early dry stage: Secondary | ICD-10-CM | POA: Diagnosis not present

## 2018-01-16 DIAGNOSIS — H25013 Cortical age-related cataract, bilateral: Secondary | ICD-10-CM | POA: Diagnosis not present

## 2018-01-16 DIAGNOSIS — H2513 Age-related nuclear cataract, bilateral: Secondary | ICD-10-CM | POA: Diagnosis not present

## 2018-01-16 DIAGNOSIS — H35721 Serous detachment of retinal pigment epithelium, right eye: Secondary | ICD-10-CM | POA: Diagnosis not present

## 2018-01-27 DIAGNOSIS — H353211 Exudative age-related macular degeneration, right eye, with active choroidal neovascularization: Secondary | ICD-10-CM | POA: Diagnosis not present

## 2018-01-27 DIAGNOSIS — H35362 Drusen (degenerative) of macula, left eye: Secondary | ICD-10-CM | POA: Diagnosis not present

## 2018-01-27 DIAGNOSIS — Z881 Allergy status to other antibiotic agents status: Secondary | ICD-10-CM | POA: Diagnosis not present

## 2018-01-27 DIAGNOSIS — H353122 Nonexudative age-related macular degeneration, left eye, intermediate dry stage: Secondary | ICD-10-CM | POA: Diagnosis not present

## 2018-01-27 DIAGNOSIS — H25813 Combined forms of age-related cataract, bilateral: Secondary | ICD-10-CM | POA: Diagnosis not present

## 2018-01-27 DIAGNOSIS — H43813 Vitreous degeneration, bilateral: Secondary | ICD-10-CM | POA: Diagnosis not present

## 2018-03-27 ENCOUNTER — Ambulatory Visit
Admission: RE | Admit: 2018-03-27 | Discharge: 2018-03-27 | Disposition: A | Discharge status: Home / Self Care | Payer: Medicare Other | Source: Ambulatory Visit | Attending: Osteopathic Medicine | Admitting: Osteopathic Medicine

## 2018-03-27 DIAGNOSIS — R921 Mammographic calcification found on diagnostic imaging of breast: Secondary | ICD-10-CM

## 2018-04-06 ENCOUNTER — Ambulatory Visit (INDEPENDENT_AMBULATORY_CARE_PROVIDER_SITE_OTHER): Payer: Medicare Other | Admitting: Osteopathic Medicine

## 2018-04-06 ENCOUNTER — Encounter: Payer: Self-pay | Admitting: Osteopathic Medicine

## 2018-04-06 VITALS — BP 170/61 | HR 68 | Temp 98.4°F | Wt 108.5 lb

## 2018-04-06 DIAGNOSIS — R030 Elevated blood-pressure reading, without diagnosis of hypertension: Secondary | ICD-10-CM

## 2018-04-06 DIAGNOSIS — M810 Age-related osteoporosis without current pathological fracture: Secondary | ICD-10-CM

## 2018-04-06 DIAGNOSIS — R7301 Impaired fasting glucose: Secondary | ICD-10-CM | POA: Diagnosis not present

## 2018-04-06 DIAGNOSIS — Z1231 Encounter for screening mammogram for malignant neoplasm of breast: Secondary | ICD-10-CM

## 2018-04-06 DIAGNOSIS — Z0000 Encounter for general adult medical examination without abnormal findings: Secondary | ICD-10-CM

## 2018-04-06 DIAGNOSIS — Z780 Asymptomatic menopausal state: Secondary | ICD-10-CM | POA: Diagnosis not present

## 2018-04-06 DIAGNOSIS — Z1239 Encounter for other screening for malignant neoplasm of breast: Secondary | ICD-10-CM

## 2018-04-06 MED ORDER — ZOSTER VAC RECOMB ADJUVANTED 50 MCG/0.5ML IM SUSR: 0.5000 mL | Freq: Once | INTRAMUSCULAR | 1 refills | Status: AC

## 2018-04-06 MED ORDER — ESTROGENS, CONJUGATED 0.625 MG/GM VA CREA: 1.0000 | TOPICAL_CREAM | VAGINAL | 12 refills | Status: DC

## 2018-04-06 NOTE — Patient Instructions (Signed)
Review preventive care:   General Preventive Care  Most recent routine screening lipids/other labs: done today   Tobacco: don't!  Exercise: as tolerated to reduce risk of cardiovascular disease and diabetes  Mental health: if need for mental health care (medicines, counseling, other), or concerns about moods, please let me know!   Vaccines  Flu vaccine: recommended every fall (by Halloween!)  Shingles vaccine: Shingrix recommended after age 70 - prescription given  Pneumonia vaccines: you've has this  Tetanus booster: Tdap recommended every 10 years  Cancer screenings   Colon cancer screening: you're good until 2021   Breast cancer screening: you're good until 2020  Other . Bone Density Test: you're good until 2020 . Advanced Directive: Living Will and/or Healthcare Power of Attorney recommended for everyone, regardless of age or health . Cholesterol: recommended screening annually . Diabetes: recommended screening annually

## 2018-04-06 NOTE — Progress Notes (Signed)
HPI: Valerie Dixon is a 72 y.o. female  who presents to [facility] today, 04/06/18,  for Medicare Annual Wellness Exam  Patient presents for annual physical/Medicare wellness exam. No complaints today.   Past medical, surgical, social and family history reviewed:  Patient Active Problem List   Diagnosis Date Noted  . Osteoporosis 03/23/2017  . Pelvic pressure in female 04/03/2015  . Urinary frequency 04/03/2015  . Nocturia 04/03/2015  . Papanicolaou smear for cervical cancer screening 04/03/2015  . White coat hypertension 04/03/2015    Past Surgical History:  Procedure Laterality Date  . TONSILLECTOMY      Social History   Socioeconomic History  . Marital status: Married    Spouse name: Not on file  . Number of children: Not on file  . Years of education: Not on file  . Highest education level: Not on file  Occupational History  . Not on file  Social Needs  . Financial resource strain: Not on file  . Food insecurity:    Worry: Not on file    Inability: Not on file  . Transportation needs:    Medical: Not on file    Non-medical: Not on file  Tobacco Use  . Smoking status: Never Smoker  . Smokeless tobacco: Never Used  Substance and Sexual Activity  . Alcohol use: No  . Drug use: No  . Sexual activity: Yes    Birth control/protection: None  Lifestyle  . Physical activity:    Days per week: Not on file    Minutes per session: Not on file  . Stress: Not on file  Relationships  . Social connections:    Talks on phone: Not on file    Gets together: Not on file    Attends religious service: Not on file    Active member of club or organization: Not on file    Attends meetings of clubs or organizations: Not on file    Relationship status: Not on file  . Intimate partner violence:    Fear of current or ex partner: Not on file    Emotionally abused: Not on file    Physically abused: Not on file    Forced sexual activity:  Not on file  Other Topics Concern  . Not on file  Social History Narrative  . Not on file    Family History  Problem Relation Age of Onset  . Kidney Stones Mother   . Cancer Father      Current medication list and allergy/intolerance information reviewed:    Outpatient Encounter Medications as of 04/06/2018  Medication Sig  . Calcium-Magnesium-Vitamin D (CALCIUM 500 PO) Take by mouth 2 (two) times daily.  . Cholecalciferol (VITAMIN D-3) 1000 UNITS CAPS Take by mouth.  . conjugated estrogens (PREMARIN) vaginal cream Place 1 Applicatorful vaginally daily. Use daily x 2 weeks, then use 3 days per week x 2 weeks, then use once per week  . LUTEIN-ZEAXANTHIN PO Take by mouth.  . Magnesium 400 MG CAPS Take by mouth.  . Melatonin 3 MG CAPS Take by mouth at bedtime.  . Methenamine-Sodium Salicylate (CYSTEX PO) Take by mouth.  . Misc Natural Products (PUMPKIN SEED OIL PO) Take by mouth.  . Multiple Vitamins-Minerals (EYE VITAMINS PO) Take by mouth.  . Omega-3 Fatty Acids (OMEGA 3 PO) Take by mouth daily.  . Phenazopyridine HCl (AZO TABS PO) Take by mouth.  . oxybutynin (DITROPAN) 5 MG tablet Take 1 tablet (5 mg total) by  mouth 3 (three) times daily. (Patient not taking: Reported on 04/06/2018)  . Probiotic Product (PROBIOTIC DAILY PO) Take by mouth.   No facility-administered encounter medications on file as of 04/06/2018.     Allergies  Allergen Reactions  . Celecoxib Swelling       Review of Systems: Review of Systems - General ROS: negative Psychological ROS: negative ENT ROS: negative Allergy and Immunology ROS: negative Hematological and Lymphatic ROS: negative Endocrine ROS: negative Respiratory ROS: no cough, shortness of breath, or wheezing Cardiovascular ROS: no chest pain or dyspnea on exertion Gastrointestinal ROS: no abdominal pain, change in bowel habits, or black or bloody stools Genito-Urinary ROS: no dysuria, trouble voiding, or hematuria Musculoskeletal ROS:  negative Neurological ROS: no TIA or stroke symptoms   Medicare Wellness Questionnaire  Are there smokers in your home (other than you)? no  Depression Screen (Note: if answer to either of the following is "Yes", a more complete depression screening is indicated)   Q1: Over the past two weeks, have you felt down, depressed or hopeless? no  Q2: Over the past two weeks, have you felt little interest or pleasure in doing things? no  Have you lost interest or pleasure in daily life? no  Do you often feel hopeless? no  Do you cry easily over simple problems? no  Activities of Daily Living In your present state of health, do you have any difficulty performing the following activities?:  Driving? no Managing money?  no Feeding yourself? no Getting from bed to chair? no Climbing a flight of stairs? no Preparing food and eating?: no Bathing or showering? no Getting dressed: no Getting to the toilet? no Using the toilet: no Moving around from place to place: no In the past year have you fallen or had a near fall?: no  Hearing Difficulties:  Do you often ask people to speak up or repeat themselves? no Do you experience ringing or noises in your ears? no  Do you have difficulty understanding soft or whispered voices? no  Memory Difficulties:  Do you feel that you have a problem with memory? no  Do you often misplace items? yes  Do you feel safe at home?  yes  Sexual Health:   Are you sexually active?  Yes  Do you have more than one partner?  No  Advanced Directives:   Advanced directives discussed: has NO advanced directive  - add't info requested. Referral to SW: not applicable  Additional information provided: yes  Risk Factors  Current exercise habits: walking  Dietary issues discussed:no concerns  Cardiac risk factors: hypertension, hypercholesterolemia/hyperlipidemia   Exam:  BP (!) 170/61 (BP Location: Left Arm, Patient Position: Sitting, Cuff Size: Small)   Pulse  68   Temp 98.4 F (36.9 C) (Oral)   Wt 108 lb 8 oz (49.2 kg)   BMI 20.50 kg/m  Vision by Snellen chart: right eye:see nurse notes, left eye:see nurse notes  Constitutional: VS see above. General Appearance: alert, well-developed, well-nourished, NAD  Ears, Nose, Mouth, Throat: MMM  Neck: No masses, trachea midline.   Respiratory: Normal respiratory effort. no wheeze, no rhonchi, no rales  Cardiovascular:No lower extremity edema.   Musculoskeletal: Gait normal. No clubbing/cyanosis of digits.   Neurological: Normal balance/coordination. No tremor. Recalls 3 objects and able to read face of watch with correct time.   Skin: warm, dry, intact. No rash/ulcer.   Psychiatric: Normal judgment/insight. Normal mood and affect. Oriented x3.     ASSESSMENT/PLAN:   Encounter for Medicare  annual wellness exam  Welcome to Medicare preventive visit - Plan: CBC, COMPLETE METABOLIC PANEL WITH GFR  Postmenopausal - Plan: CBC, COMPLETE METABOLIC PANEL WITH GFR  Breast cancer screening - Plan: CBC, COMPLETE METABOLIC PANEL WITH GFR  Age-related osteoporosis without current pathological fracture - Plan: CBC, COMPLETE METABOLIC PANEL WITH GFR  White coat syndrome without hypertension - Plan: CBC, COMPLETE METABOLIC PANEL WITH GFR   Patient Instructions  Review preventive care:   General Preventive Care  Most recent routine screening lipids/other labs: done today   Tobacco: don't!  Exercise: as tolerated to reduce risk of cardiovascular disease and diabetes  Mental health: if need for mental health care (medicines, counseling, other), or concerns about moods, please let me know!   Vaccines  Flu vaccine: recommended every fall (by Halloween!)  Shingles vaccine: Shingrix recommended after age 31 - prescription given  Pneumonia vaccines: you've has this  Tetanus booster: Tdap recommended every 10 years  Cancer screenings   Colon cancer screening: you're good until 2021    Breast cancer screening: you're good until 2020  Other . Bone Density Test: you're good until 2020 . Advanced Directive: Living Will and/or Healthcare Power of Attorney recommended for everyone, regardless of age or health . Cholesterol: recommended screening annually . Diabetes: recommended screening annually           Immunization History  Administered Date(s) Administered  . Pneumococcal Polysaccharide-23 04/06/2017    During the course of the visit the patient was educated and counseled about appropriate screening and preventive services as noted above.   Patient Instructions (the written plan) was given to the patient.  Medicare Attestation I have personally reviewed: The patient's medical and social history Their use of alcohol, tobacco or illicit drugs Their current medications and supplements The patient's functional ability including ADLs,fall risks, home safety risks, cognitive, and hearing and visual impairment Diet and physical activities Evidence for depression or mood disorders  The patient's weight, height, BMI, and visual acuity have been recorded in the chart.  I have made referrals, counseling, and provided education to the patient based on review of the above and I have provided the patient with a written personalized care plan for preventive services.     Emeterio Reeve, DO   04/06/18   Visit summary with medication list and pertinent instructions was printed for patient to review. All questions at time of visit were answered - patient instructed to contact office with any additional concerns. ER/RTC precautions were reviewed with the patient. Follow-up plan: Return in about 1 year (around 04/07/2019) for [place], sooner if needed .

## 2018-04-08 LAB — CBC
HCT: 44.6 % (ref 35.0–45.0)
HEMOGLOBIN: 14.9 g/dL (ref 11.7–15.5)
MCH: 32 pg (ref 27.0–33.0)
MCHC: 33.4 g/dL (ref 32.0–36.0)
MCV: 95.9 fL (ref 80.0–100.0)
MPV: 10.1 fL (ref 7.5–12.5)
Platelets: 236 10*3/uL (ref 140–400)
RBC: 4.65 10*6/uL (ref 3.80–5.10)
RDW: 12.2 % (ref 11.0–15.0)
WBC: 4.5 10*3/uL (ref 3.8–10.8)

## 2018-04-08 LAB — COMPLETE METABOLIC PANEL WITH GFR
AG Ratio: 1.9 (calc) (ref 1.0–2.5)
ALBUMIN MSPROF: 4.6 g/dL (ref 3.6–5.1)
ALKALINE PHOSPHATASE (APISO): 70 U/L (ref 33–130)
ALT: 20 U/L (ref 6–29)
AST: 26 U/L (ref 10–35)
BUN: 22 mg/dL (ref 7–25)
CO2: 29 mmol/L (ref 20–32)
CREATININE: 0.75 mg/dL (ref 0.60–0.93)
Calcium: 9.9 mg/dL (ref 8.6–10.4)
Chloride: 101 mmol/L (ref 98–110)
GFR, EST AFRICAN AMERICAN: 94 mL/min/{1.73_m2} (ref >=60)
GFR, Est Non African American: 81 mL/min/{1.73_m2} (ref >=60)
GLUCOSE: 102 mg/dL — AB (ref 65–99)
Globulin: 2.4 g/dL (calc) (ref 1.9–3.7)
Potassium: 4.2 mmol/L (ref 3.5–5.3)
Sodium: 138 mmol/L (ref 135–146)
TOTAL PROTEIN: 7 g/dL (ref 6.1–8.1)
Total Bilirubin: 0.9 mg/dL (ref 0.2–1.2)

## 2018-04-08 LAB — TEST AUTHORIZATION

## 2018-04-08 LAB — HEMOGLOBIN A1C W/OUT EAG: Hgb A1c MFr Bld: 5.2 % of total Hgb (ref <5.7)

## 2018-05-05 DIAGNOSIS — H43813 Vitreous degeneration, bilateral: Secondary | ICD-10-CM | POA: Diagnosis not present

## 2018-05-05 DIAGNOSIS — H353122 Nonexudative age-related macular degeneration, left eye, intermediate dry stage: Secondary | ICD-10-CM | POA: Diagnosis not present

## 2018-05-05 DIAGNOSIS — H353211 Exudative age-related macular degeneration, right eye, with active choroidal neovascularization: Secondary | ICD-10-CM | POA: Diagnosis not present

## 2018-05-05 DIAGNOSIS — H25813 Combined forms of age-related cataract, bilateral: Secondary | ICD-10-CM | POA: Diagnosis not present

## 2018-05-24 DIAGNOSIS — Z23 Encounter for immunization: Secondary | ICD-10-CM | POA: Diagnosis not present

## 2018-07-19 DIAGNOSIS — H353131 Nonexudative age-related macular degeneration, bilateral, early dry stage: Secondary | ICD-10-CM | POA: Diagnosis not present

## 2018-09-06 DIAGNOSIS — H35363 Drusen (degenerative) of macula, bilateral: Secondary | ICD-10-CM | POA: Diagnosis not present

## 2018-09-06 DIAGNOSIS — H25813 Combined forms of age-related cataract, bilateral: Secondary | ICD-10-CM | POA: Diagnosis not present

## 2018-09-06 DIAGNOSIS — H353122 Nonexudative age-related macular degeneration, left eye, intermediate dry stage: Secondary | ICD-10-CM | POA: Diagnosis not present

## 2018-09-06 DIAGNOSIS — H353211 Exudative age-related macular degeneration, right eye, with active choroidal neovascularization: Secondary | ICD-10-CM | POA: Diagnosis not present

## 2018-09-06 DIAGNOSIS — H43813 Vitreous degeneration, bilateral: Secondary | ICD-10-CM | POA: Diagnosis not present

## 2018-12-13 ENCOUNTER — Ambulatory Visit: Payer: Medicare Other | Admitting: Osteopathic Medicine

## 2018-12-14 ENCOUNTER — Encounter: Payer: Self-pay | Admitting: Osteopathic Medicine

## 2018-12-14 ENCOUNTER — Ambulatory Visit (INDEPENDENT_AMBULATORY_CARE_PROVIDER_SITE_OTHER): Payer: Medicare Other | Admitting: Osteopathic Medicine

## 2018-12-14 VITALS — BP 174/72 | HR 69 | Temp 98.5°F | Wt 105.0 lb

## 2018-12-14 DIAGNOSIS — F411 Generalized anxiety disorder: Secondary | ICD-10-CM

## 2018-12-14 DIAGNOSIS — I10 Essential (primary) hypertension: Secondary | ICD-10-CM

## 2018-12-14 DIAGNOSIS — R399 Unspecified symptoms and signs involving the genitourinary system: Secondary | ICD-10-CM

## 2018-12-14 DIAGNOSIS — N952 Postmenopausal atrophic vaginitis: Secondary | ICD-10-CM

## 2018-12-14 LAB — POCT URINALYSIS DIPSTICK
Bilirubin, UA: NEGATIVE
Blood, UA: NEGATIVE
Glucose, UA: NEGATIVE
Ketones, UA: NEGATIVE
Leukocytes, UA: NEGATIVE
Nitrite, UA: NEGATIVE
Protein, UA: NEGATIVE
Spec Grav, UA: 1.015
Urobilinogen, UA: 0.2 U/dL
pH, UA: 6.5

## 2018-12-14 NOTE — Patient Instructions (Signed)
Plan:  Await urine testing, there does not appear to be any infection.  You can increase the usage of your estrogen cream to 2-3 times per week.   We will get blood work today, likely will want to start a low-dose of blood pressure medication based on labs.  As far as anxiety is concerned, we can do as needed medication for severe panic/anxiety episodes such as alprazolam/Xanax, buspirone/BuSpar, hydroxyzine/Vistaril.  We can also discuss starting on a maintenance medication for prevention of anxiety issues.  Let me know if this is something you would like to talk about more, this would be a good thing to set up a virtual visit to discuss in detail.

## 2018-12-14 NOTE — Progress Notes (Signed)
HPI: Valerie Dixon is a 73 y.o. female who  has a past medical history of Osteoporosis (03/23/2017).  she presents to [facility] today, 12/14/18,  for chief complaint of:  Concern for elevated blood pressure Concern for urinary tract infection  On intake, our automatic blood pressure monitor was reading 180/56, patient's home machine was reading 174/82.  Reports home BP readings ok in AM but higher as the day goes on up to 371G systolic. No CP/SOB, no HA/VC, no dizziness.    BP Readings from Last 3 Encounters:  12/14/18 (!) 174/72  04/06/18 (!) 170/61  04/06/17 (!) 174/64    Complaints of burning in vaginal area, better w/ premarin but she's only applying it weekly. Occasional burning with urination.    Reports longstanding low-level anxiety, seems to be getting worse lately, she's more irritable and nervous.        At today's visit 12/15/18 ... PMH, PSH, FH reviewed and updated as needed.  Current medication list and allergy/intolerance hx reviewed and updated as needed. (See remainder of HPI, ROS, Phys Exam below)   No results found.  Results for orders placed or performed in visit on 12/14/18 (from the past 72 hour(s))  Urinalysis, microscopic only     Status: None   Collection Time: 12/14/18 12:55 PM  Result Value Ref Range   WBC, UA NONE SEEN 0 - 5 /HPF   RBC / HPF NONE SEEN 0 - 2 /HPF   Squamous Epithelial / LPF NONE SEEN < OR = 5 /HPF   Bacteria, UA NONE SEEN NONE SEEN /HPF   Hyaline Cast NONE SEEN NONE SEEN /LPF  POCT Urinalysis Dipstick     Status: None   Collection Time: 12/14/18  1:20 PM  Result Value Ref Range   Color, UA YELLOW    Clarity, UA CLEAR    Glucose, UA Negative Negative   Bilirubin, UA Negative    Ketones, UA Negative    Spec Grav, UA 1.015 1.010 - 1.025   Blood, UA Negative    pH, UA 6.5 5.0 - 8.0   Protein, UA Negative Negative   Urobilinogen, UA 0.2 0.2 or 1.0 E.U./dL   Nitrite, UA Negative    Leukocytes, UA Negative Negative   Appearance     Odor    CBC     Status: None   Collection Time: 12/14/18  1:52 PM  Result Value Ref Range   WBC 4.7 3.8 - 10.8 Thousand/uL   RBC 4.57 3.80 - 5.10 Million/uL   Hemoglobin 14.6 11.7 - 15.5 g/dL   HCT 43.5 35.0 - 45.0 %   MCV 95.2 80.0 - 100.0 fL   MCH 31.9 27.0 - 33.0 pg   MCHC 33.6 32.0 - 36.0 g/dL   RDW 12.3 11.0 - 15.0 %   Platelets 234 140 - 400 Thousand/uL   MPV 10.2 7.5 - 12.5 fL  COMPLETE METABOLIC PANEL WITH GFR     Status: Abnormal   Collection Time: 12/14/18  1:52 PM  Result Value Ref Range   Glucose, Bld 103 (H) 65 - 99 mg/dL    Comment: .            Fasting reference interval . For someone without known diabetes, a glucose value between 100 and 125 mg/dL is consistent with prediabetes and should be confirmed with a follow-up test. .    BUN 13 7 - 25 mg/dL   Creat 0.71 0.60 - 0.93 mg/dL  Comment: For patients >69 years of age, the reference limit for Creatinine is approximately 13% higher for people identified as African-American. .    GFR, Est Non African American 86 > OR = 60 mL/min/1.31m2   GFR, Est African American 100 > OR = 60 mL/min/1.71m2   BUN/Creatinine Ratio NOT APPLICABLE 6 - 22 (calc)   Sodium 136 135 - 146 mmol/L   Potassium 4.2 3.5 - 5.3 mmol/L   Chloride 99 98 - 110 mmol/L   CO2 28 20 - 32 mmol/L   Calcium 9.5 8.6 - 10.4 mg/dL   Total Protein 6.8 6.1 - 8.1 g/dL   Albumin 4.5 3.6 - 5.1 g/dL   Globulin 2.3 1.9 - 3.7 g/dL (calc)   AG Ratio 2.0 1.0 - 2.5 (calc)   Total Bilirubin 0.7 0.2 - 1.2 mg/dL   Alkaline phosphatase (APISO) 76 37 - 153 U/L   AST 30 10 - 35 U/L   ALT 24 6 - 29 U/L  TSH     Status: None   Collection Time: 12/14/18  1:52 PM  Result Value Ref Range   TSH 2.25 0.40 - 4.50 mIU/L  ABN Test Refusal     Status: None   Collection Time: 12/14/18  1:52 PM  Result Value Ref Range   RAM      Comment: . Be advised that your patient has indicated on the advance beneficiary  notice their decision not to receive the following laboratory tests. As a result, the tests will not be performed. .    ABN TEST REFUSED 7,600           ASSESSMENT/PLAN: The primary encounter diagnosis was Essential hypertension. Diagnoses of UTI symptoms, Vaginal atrophy, and Anxiety state were also pertinent to this visit.    Orders Placed This Encounter  Procedures  . Urine Culture  . Urinalysis, microscopic only  . CBC  . COMPLETE METABOLIC PANEL WITH GFR  . Lipid panel  . TSH  . ABN Test Refusal  . BASIC METABOLIC PANEL WITH GFR  . POCT Urinalysis Dipstick     Meds ordered this encounter  Medications  . lisinopril (ZESTRIL) 5 MG tablet    Sig: Take 1 tablet (5 mg total) by mouth daily.    Dispense:  90 tablet    Refill:  0    Patient Instructions  Plan:  Await urine testing, there does not appear to be any infection.  You can increase the usage of your estrogen cream to 2-3 times per week.   We will get blood work today, likely will want to start a low-dose of blood pressure medication based on labs.  As far as anxiety is concerned, we can do as needed medication for severe panic/anxiety episodes such as alprazolam/Xanax, buspirone/BuSpar, hydroxyzine/Vistaril.  We can also discuss starting on a maintenance medication for prevention of anxiety issues.  Let me know if this is something you would like to talk about more, this would be a good thing to set up a virtual visit to discuss in detail.        Follow-up plan: Return in about 4 weeks (around 01/11/2019) for LAB ONLY in 4 weeks, virtual visit to monitor BP in 1-2 weeks .                                                 ################################################# ################################################# ################################################# #################################################  Current Meds  Medication Sig  .  Calcium-Magnesium-Vitamin D (CALCIUM 500 PO) Take by mouth 2 (two) times daily.  . Cholecalciferol (VITAMIN D-3) 1000 UNITS CAPS Take by mouth.  . conjugated estrogens (PREMARIN) vaginal cream Place 1 Applicatorful vaginally once a week.  . LUTEIN-ZEAXANTHIN PO Take by mouth.  . Magnesium 400 MG CAPS Take by mouth.  . Melatonin 3 MG CAPS Take by mouth at bedtime.  . Methenamine-Sodium Salicylate (CYSTEX PO) Take by mouth.  . Misc Natural Products (PUMPKIN SEED OIL PO) Take by mouth.  . Multiple Vitamins-Minerals (EYE VITAMINS PO) Take by mouth.  . Omega-3 Fatty Acids (OMEGA 3 PO) Take by mouth daily.  . Phenazopyridine HCl (AZO TABS PO) Take by mouth.  . Probiotic Product (PROBIOTIC DAILY PO) Take by mouth.    Allergies  Allergen Reactions  . Celecoxib Swelling       Review of Systems:  Constitutional: No recent illness  HEENT: No  headache, no vision change  Cardiac: No  chest pain, No  pressure, No palpitations  Respiratory:  No  shortness of breath. No  Cough  Gastrointestinal: No  abdominal pain, no change on bowel habits  Skin: No  Rash  Neurologic: No  weakness, No  Dizziness  Psychiatric: No  concerns with depression, +concerns with anxiety  Exam:  BP (!) 174/72 (BP Location: Left Arm, Patient Position: Sitting, Cuff Size: Normal)   Pulse 69   Temp 98.5 F (36.9 C) (Oral)   Wt 105 lb (47.6 kg)   BMI 19.84 kg/m   Constitutional: VS see above. General Appearance: alert, well-developed, well-nourished, NAD  Eyes: Normal lids and conjunctive, non-icteric sclera  Ears, Nose, Mouth, Throat: MMM, Normal external inspection ears/nares/mouth/lips/gums.  Neck: No masses, trachea midline.   Respiratory: Normal respiratory effort. no wheeze, no rhonchi, no rales  Cardiovascular: S1/S2 normal, no murmur, no rub/gallop auscultated. RRR.   Musculoskeletal: Gait normal. Symmetric and independent movement of all extremities  Neurological: Normal  balance/coordination. No tremor.  Skin: warm, dry, intact.   Psychiatric: Normal judgment/insight. Normal mood and affect. Oriented x3.       Visit summary with medication list and pertinent instructions was printed for patient to review, patient was advised to alert [place] if any updates are needed. All questions at time of visit were answered - patient instructed to contact office with any additional concerns. ER/RTC precautions were reviewed with the patient and understanding verbalized.     Please note: voice recognition software was used to produce this document, and typos may escape review. Please contact Dr. Sheppard Coil for any needed clarifications.    Follow up plan: Return in about 4 weeks (around 01/11/2019) for LAB ONLY in 4 weeks, virtual visit to monitor BP in 1-2 weeks .

## 2018-12-15 ENCOUNTER — Encounter: Payer: Self-pay | Admitting: Osteopathic Medicine

## 2018-12-15 LAB — URINE CULTURE
MICRO NUMBER:: 474971
SPECIMEN QUALITY:: ADEQUATE

## 2018-12-15 LAB — CBC
HCT: 43.5 % (ref 35.0–45.0)
Hemoglobin: 14.6 g/dL (ref 11.7–15.5)
MCH: 31.9 pg (ref 27.0–33.0)
MCHC: 33.6 g/dL (ref 32.0–36.0)
MCV: 95.2 fL (ref 80.0–100.0)
MPV: 10.2 fL (ref 7.5–12.5)
Platelets: 234 10*3/uL (ref 140–400)
RBC: 4.57 10*6/uL (ref 3.80–5.10)
RDW: 12.3 % (ref 11.0–15.0)
WBC: 4.7 10*3/uL (ref 3.8–10.8)

## 2018-12-15 LAB — COMPLETE METABOLIC PANEL WITH GFR
AG Ratio: 2 (calc) (ref 1.0–2.5)
ALT: 24 U/L (ref 6–29)
AST: 30 U/L (ref 10–35)
Albumin: 4.5 g/dL (ref 3.6–5.1)
Alkaline phosphatase (APISO): 76 U/L (ref 37–153)
BUN: 13 mg/dL (ref 7–25)
CO2: 28 mmol/L (ref 20–32)
Calcium: 9.5 mg/dL (ref 8.6–10.4)
Chloride: 99 mmol/L (ref 98–110)
Creat: 0.71 mg/dL (ref 0.60–0.93)
GFR, Est African American: 100 mL/min/{1.73_m2} (ref >=60)
GFR, Est Non African American: 86 mL/min/{1.73_m2} (ref >=60)
Globulin: 2.3 g/dL (calc) (ref 1.9–3.7)
Glucose, Bld: 103 mg/dL — ABNORMAL HIGH (ref 65–99)
Potassium: 4.2 mmol/L (ref 3.5–5.3)
Sodium: 136 mmol/L (ref 135–146)
Total Bilirubin: 0.7 mg/dL (ref 0.2–1.2)
Total Protein: 6.8 g/dL (ref 6.1–8.1)

## 2018-12-15 LAB — URINALYSIS, MICROSCOPIC ONLY
Bacteria, UA: NONE SEEN /HPF
Hyaline Cast: NONE SEEN /LPF
RBC / HPF: NONE SEEN /HPF (ref 0–2)
Squamous Epithelial / LPF: NONE SEEN /HPF (ref <=5)
WBC, UA: NONE SEEN /HPF (ref 0–5)

## 2018-12-15 LAB — ABN TEST REFUSAL: ABN TEST REFUSED: 7600

## 2018-12-15 LAB — TSH: TSH: 2.25 mIU/L (ref 0.40–4.50)

## 2018-12-15 MED ORDER — LISINOPRIL 5 MG PO TABS: 5.0000 mg | ORAL_TABLET | Freq: Every day | ORAL | 0 refills | Status: DC

## 2018-12-19 ENCOUNTER — Telehealth: Payer: Self-pay

## 2018-12-19 NOTE — Telephone Encounter (Addendum)
Pt called stating she is still having UTI/vaginal problems. Requesting if provider can send in flagyl to [place].

## 2018-12-20 DIAGNOSIS — H353211 Exudative age-related macular degeneration, right eye, with active choroidal neovascularization: Secondary | ICD-10-CM | POA: Diagnosis not present

## 2018-12-20 DIAGNOSIS — H43813 Vitreous degeneration, bilateral: Secondary | ICD-10-CM | POA: Diagnosis not present

## 2018-12-20 DIAGNOSIS — H25813 Combined forms of age-related cataract, bilateral: Secondary | ICD-10-CM | POA: Diagnosis not present

## 2018-12-20 DIAGNOSIS — H2513 Age-related nuclear cataract, bilateral: Secondary | ICD-10-CM | POA: Diagnosis not present

## 2018-12-20 DIAGNOSIS — H353122 Nonexudative age-related macular degeneration, left eye, intermediate dry stage: Secondary | ICD-10-CM | POA: Diagnosis not present

## 2018-12-20 MED ORDER — METRONIDAZOLE 500 MG PO TABS: 500.0000 mg | ORAL_TABLET | Freq: Two times a day (BID) | ORAL | 0 refills | Status: AC

## 2018-12-20 NOTE — Telephone Encounter (Signed)
Can try flgayl but if that's not helpful I think we might need her to see a GYN or urology specialist given her persistent symptoms - she should call us if symptoms persist despite the Flagyl

## 2018-12-20 NOTE — Telephone Encounter (Signed)
Left a detailed vm msg for pt regarding antibiotic rx and provider's note. Call back info provided.

## 2018-12-26 DIAGNOSIS — Z124 Encounter for screening for malignant neoplasm of cervix: Secondary | ICD-10-CM | POA: Diagnosis not present

## 2018-12-26 DIAGNOSIS — N949 Unspecified condition associated with female genital organs and menstrual cycle: Secondary | ICD-10-CM | POA: Diagnosis not present

## 2018-12-26 DIAGNOSIS — Z01419 Encounter for gynecological examination (general) (routine) without abnormal findings: Secondary | ICD-10-CM | POA: Diagnosis not present

## 2018-12-26 DIAGNOSIS — N898 Other specified noninflammatory disorders of vagina: Secondary | ICD-10-CM | POA: Diagnosis not present

## 2019-01-02 ENCOUNTER — Telehealth: Payer: Self-pay

## 2019-01-02 NOTE — Telephone Encounter (Signed)
Let's schedule a virtual visit?

## 2019-01-02 NOTE — Telephone Encounter (Signed)
Appointment has been made. No further questions at this time.  

## 2019-01-02 NOTE — Telephone Encounter (Signed)
Pt left a vm msg stating her bp has been in the normal range. She has not started the bp rx given at last visit. Pt wants to discuss bp rx and results from GYN provider regarding ongoing UTI issues. Pls advise, thanks.

## 2019-01-03 ENCOUNTER — Ambulatory Visit (INDEPENDENT_AMBULATORY_CARE_PROVIDER_SITE_OTHER): Payer: Medicare Other | Admitting: Osteopathic Medicine

## 2019-01-03 ENCOUNTER — Encounter: Payer: Self-pay | Admitting: Osteopathic Medicine

## 2019-01-03 VITALS — BP 130/68 | Wt 104.0 lb

## 2019-01-03 DIAGNOSIS — R300 Dysuria: Secondary | ICD-10-CM | POA: Diagnosis not present

## 2019-01-03 DIAGNOSIS — R350 Frequency of micturition: Secondary | ICD-10-CM | POA: Diagnosis not present

## 2019-01-03 NOTE — Progress Notes (Signed)
Virtual Visit via Phone Note (delined web visit)   I connected with      Valerie Dixon on 01/03/19 at 10:01 AM  by a telemedicine application and verified that I am speaking with the correct person using two identifiers.  Patient is at home I am in office    I discussed the limitations of evaluation and management by telemedicine and the availability of in person appointments. The patient expressed understanding and agreed to proceed.  History of Present Illness: Valerie Dixon is a 73 y.o. female who would like to discuss  HTN UTI symptoms     BP is normal, hasn't taken meds.  Burning urination persists GYN visit: no concerns Burning worse at night Associated w/ heaviness and pressure Seems to be occasionally worse w/ eating Urinary frequency        Observations/Objective: BP 130/68 (Patient Position: Sitting, Cuff Size: Normal)   Wt 104 lb (47.2 kg)   BMI 19.65 kg/m  BP Readings from Last 3 Encounters:  01/03/19 130/68  12/14/18 (!) 174/72  04/06/18 (!) 170/61   Exam: Normal Speech.   Lab and Radiology Results No results found for this or any previous visit (from the past 72 hour(s)). No results found.     Assessment and Plan: 73 y.o. female with The primary encounter diagnosis was Burning with urination. A diagnosis of Urinary frequency was also pertinent to this visit.  Suspect interstitial cystitis   Continue Azo Increase vaginal estrogen to three times per week Referral to urology  PDMP not reviewed this encounter. Orders Placed This Encounter  Procedures  . Ambulatory referral to Urology    Referral Priority:   Routine    Referral Type:   Consultation    Referral Reason:   Specialty Services Required    Referred to Provider:   Christy Sartorius, MD    Requested Specialty:   Urology    Number of Visits Requested:   1   No orders of the defined types were placed in this encounter.  There are no Patient Instructions on file for this  visit.  Instructions sent via MyChart. If MyChart not available, pt was given option for info via personal e-mail w/ no guarantee of protected health info over unsecured e-mail communication, and MyChart sign-up instructions were included.   Follow Up Instructions: Return if symptoms worsen/change .    I discussed the assessment and treatment plan with the patient. The patient was provided an opportunity to ask questions and all were answered. The patient agreed with the plan and demonstrated an understanding of the instructions.   The patient was advised to call back or seek an in-person evaluation if any new concerns, if symptoms worsen or if the condition fails to improve as anticipated.  21 minutes of non-face-to-face time was provided during this encounter.                      Historical information moved to improve visibility of documentation.  Past Medical History:  Diagnosis Date  . Osteoporosis 03/23/2017   DEXA 03/2017   Past Surgical History:  Procedure Laterality Date  . TONSILLECTOMY     Social History   Tobacco Use  . Smoking status: Never Smoker  . Smokeless tobacco: Never Used  Substance Use Topics  . Alcohol use: No   family history includes Cancer in her father; Kidney Stones in her mother.  Medications: Current Outpatient Medications  Medication Sig Dispense Refill  .  Calcium-Magnesium-Vitamin D (CALCIUM 500 PO) Take by mouth 2 (two) times daily.    . Cholecalciferol (VITAMIN D-3) 1000 UNITS CAPS Take by mouth.    . conjugated estrogens (PREMARIN) vaginal cream Place 1 Applicatorful vaginally once a week. 42.5 g 12  . LUTEIN-ZEAXANTHIN PO Take by mouth.    . Magnesium 400 MG CAPS Take by mouth.    . Melatonin 3 MG CAPS Take by mouth at bedtime.    . Methenamine-Sodium Salicylate (CYSTEX PO) Take by mouth.    . Misc Natural Products (PUMPKIN SEED OIL PO) Take by mouth.    . Multiple Vitamins-Minerals (EYE VITAMINS PO) Take by mouth.     . Phenazopyridine HCl (AZO TABS PO) Take by mouth.    . Omega-3 Fatty Acids (OMEGA 3 PO) Take by mouth daily.    . Probiotic Product (PROBIOTIC DAILY PO) Take by mouth.     No current facility-administered medications for this visit.    Allergies  Allergen Reactions  . Celecoxib Swelling    PDMP not reviewed this encounter. Orders Placed This Encounter  Procedures  . Ambulatory referral to Urology    Referral Priority:   Routine    Referral Type:   Consultation    Referral Reason:   Specialty Services Required    Referred to Provider:   Christy Sartorius, MD    Requested Specialty:   Urology    Number of Visits Requested:   1   No orders of the defined types were placed in this encounter.

## 2019-01-04 ENCOUNTER — Telehealth: Payer: Self-pay

## 2019-01-04 NOTE — Telephone Encounter (Signed)
Referral was just sent if patient does not hear in a few days I will resend - CF

## 2019-01-04 NOTE — Telephone Encounter (Signed)
Pt called stating that Urologist office has not received referral request. Pt is requesting to expedite referral order. Thanks.

## 2019-01-08 DIAGNOSIS — H00011 Hordeolum externum right upper eyelid: Secondary | ICD-10-CM | POA: Diagnosis not present

## 2019-01-12 DIAGNOSIS — N761 Subacute and chronic vaginitis: Secondary | ICD-10-CM | POA: Diagnosis not present

## 2019-01-12 DIAGNOSIS — R300 Dysuria: Secondary | ICD-10-CM | POA: Diagnosis not present

## 2019-01-26 DIAGNOSIS — R300 Dysuria: Secondary | ICD-10-CM | POA: Diagnosis not present

## 2019-03-16 DIAGNOSIS — N952 Postmenopausal atrophic vaginitis: Secondary | ICD-10-CM | POA: Diagnosis not present

## 2019-03-16 DIAGNOSIS — R300 Dysuria: Secondary | ICD-10-CM | POA: Diagnosis not present

## 2019-03-16 DIAGNOSIS — N9489 Other specified conditions associated with female genital organs and menstrual cycle: Secondary | ICD-10-CM | POA: Diagnosis not present

## 2019-03-21 DIAGNOSIS — H353211 Exudative age-related macular degeneration, right eye, with active choroidal neovascularization: Secondary | ICD-10-CM | POA: Diagnosis not present

## 2019-03-21 DIAGNOSIS — H43813 Vitreous degeneration, bilateral: Secondary | ICD-10-CM | POA: Diagnosis not present

## 2019-03-21 DIAGNOSIS — H25813 Combined forms of age-related cataract, bilateral: Secondary | ICD-10-CM | POA: Diagnosis not present

## 2019-03-21 DIAGNOSIS — H353122 Nonexudative age-related macular degeneration, left eye, intermediate dry stage: Secondary | ICD-10-CM | POA: Diagnosis not present

## 2019-04-12 DIAGNOSIS — Z23 Encounter for immunization: Secondary | ICD-10-CM | POA: Diagnosis not present

## 2019-04-16 ENCOUNTER — Other Ambulatory Visit: Payer: Self-pay

## 2019-04-16 ENCOUNTER — Encounter: Payer: Self-pay | Admitting: Osteopathic Medicine

## 2019-04-16 ENCOUNTER — Ambulatory Visit (INDEPENDENT_AMBULATORY_CARE_PROVIDER_SITE_OTHER): Payer: Medicare Other | Admitting: Osteopathic Medicine

## 2019-04-16 VITALS — BP 169/70 | HR 68 | Temp 98.5°F | Wt 102.2 lb

## 2019-04-16 DIAGNOSIS — F411 Generalized anxiety disorder: Secondary | ICD-10-CM | POA: Diagnosis not present

## 2019-04-16 DIAGNOSIS — Z780 Asymptomatic menopausal state: Secondary | ICD-10-CM

## 2019-04-16 DIAGNOSIS — M8580 Other specified disorders of bone density and structure, unspecified site: Secondary | ICD-10-CM

## 2019-04-16 DIAGNOSIS — R030 Elevated blood-pressure reading, without diagnosis of hypertension: Secondary | ICD-10-CM

## 2019-04-16 DIAGNOSIS — I10 Essential (primary) hypertension: Secondary | ICD-10-CM | POA: Diagnosis not present

## 2019-04-16 MED ORDER — ZOSTER VAC RECOMB ADJUVANTED 50 MCG/0.5ML IM SUSR: 0.5000 mL | Freq: Once | INTRAMUSCULAR | 1 refills | Status: AC

## 2019-04-16 MED ORDER — TETANUS-DIPHTH-ACELL PERTUSSIS 5-2-15.5 LF-MCG/0.5 IM SUSP: 0.5000 mL | Freq: Once | INTRAMUSCULAR | 0 refills | Status: AC

## 2019-04-16 NOTE — Patient Instructions (Addendum)
General Preventive Care  Most recent routine screening lipids/other labs:   Tobacco: don't!   Alcohol: responsible moderation is ok for most adults - if you have concerns about your alcohol intake, please talk to me!   Exercise: as tolerated to reduce risk of cardiovascular disease and diabetes. Strength training will also prevent osteoporosis.   Mental health: if need for mental health care (medicines, counseling, other), or concerns about moods, please let me know!   Sexual health: if need for STD testing, or if concerns with libido/pain problems, please let me know!  Advanced Directive: Living Will and/or Healthcare Power of Attorney recommended for all adults, regardless of age or health.  Vaccines  Flu vaccine: recommended for almost everyone, every fall.   Shingles vaccine: Shingrix recommended after age 77. Rx to pharmacy - medicare won't let us do it here!    Pneumonia vaccines: done!   Tetanus booster: Tdap recommended every 10 years. Rx to pharmacy - medicare won't let us do it here!   Cancer screenings   Colon cancer screening: Cologuard due next year   Breast cancer screening: mammogram due - please call [facility]!  Cervical cancer screening: Pap - Can usually stop at age 74 or w/ hysterectomy.   Lung cancer screening: not needed for nonsmokers  Infection screenings . HIV, Gonorrhea/Chlamydia: screening as needed . Hepatitis C: recommended for anyone born 41-1963 . TB: certain at-risk populations, or depending on work requirements and/or travel history Other . Bone Density Test: recommended for women at age 49, men at age 73, sooner depending on risk factors . Abdominal Aortic Aneurysm: screening with ultrasound recommended once for men age 54-75 who have ever smoked

## 2019-04-16 NOTE — Progress Notes (Signed)
HPI: Valerie Dixon is a 73 y.o. female who  has a past medical history of Osteoporosis (03/23/2017).  she presents to [facility] today, 04/16/19,  for chief complaint of: Routine check-up  Blood pressures have been good at home.  Still with typically in the Q000111Q, diastolic Q000111Q to [id number].  No chest pain, pressure, shortness of breath.  Was previously on lisinopril but stopped this medication since blood pressures were fine.   Past medical, surgical, social and family history reviewed:  Patient Active Problem List   Diagnosis Date Noted  . Osteoporosis 03/23/2017  . Pelvic pressure in female 04/03/2015  . Urinary frequency 04/03/2015  . Nocturia 04/03/2015  . Papanicolaou smear for cervical cancer screening 04/03/2015  . White coat hypertension 04/03/2015    Past Surgical History:  Procedure Laterality Date  . TONSILLECTOMY      Social History   Tobacco Use  . Smoking status: Never Smoker  . Smokeless tobacco: Never Used  Substance Use Topics  . Alcohol use: No    Family History  Problem Relation Age of Onset  . Kidney Stones Mother   . Cancer Father      Current medication list and allergy/intolerance information reviewed:    Current Outpatient Medications  Medication Sig Dispense Refill  . Calcium-Magnesium-Vitamin D (CALCIUM 500 PO) Take by mouth 2 (two) times daily.    . Cholecalciferol (VITAMIN D-3) 1000 UNITS CAPS Take by mouth.    . conjugated estrogens (PREMARIN) vaginal cream Place 1 Applicatorful vaginally once a week. 42.5 g 12  . LUTEIN-ZEAXANTHIN PO Take by mouth.    . Magnesium 400 MG CAPS Take by mouth.    . Melatonin 3 MG CAPS Take by mouth at bedtime.    . Multiple Vitamins-Minerals (EYE VITAMINS PO) Take by mouth.    . Methenamine-Sodium Salicylate (CYSTEX PO) Take by mouth.    . Misc Natural Products (PUMPKIN SEED OIL PO) Take by mouth.    . Omega-3 Fatty Acids (OMEGA 3 PO) Take by mouth daily.    .  Phenazopyridine HCl (AZO TABS PO) Take by mouth.    . Probiotic Product (PROBIOTIC DAILY PO) Take by mouth.     No current facility-administered medications for this visit.     Allergies  Allergen Reactions  . Celecoxib Swelling      Review of Systems:  Constitutional:  No  fever, no chills, No recent illness  HEENT: No  headache, no vision change  Cardiac: No  chest pain, No  pressure, No palpitations, No  Orthopnea  Respiratory:  No  shortness of breath. No  Cough  Gastrointestinal: No  abdominal pain, No  nausea  Musculoskeletal: No new myalgia/arthralgia  Skin: No  Rash  Genitourinary: No  incontinence, No  abnormal genital bleeding, No abnormal genital discharge  Hem/Onc: No  easy bruising/bleeding  Endocrine: No cold intolerance,  No heat intolerance.   Neurologic: No  weakness, No  dizziness  Psychiatric: No  concerns with depression, No  concerns with anxiety, No sleep problems, No mood problems  Exam:  BP (!) 169/70 (BP Location: Left Arm, Patient Position: Sitting, Cuff Size: Small)   Pulse 68   Temp 98.5 F (36.9 C) (Oral)   Wt 102 lb 3.2 oz (46.4 kg)   BMI 19.31 kg/m   Constitutional: VS see above. General Appearance: alert, well-developed, well-nourished, NAD  Eyes: Normal lids and conjunctive, non-icteric sclera  Ears, Nose, Mouth, Throat: TM normal bilaterally.   Neck: No  masses, trachea midline. No thyroid enlargement. No tenderness/mass appreciated. No lymphadenopathy  Respiratory: Normal respiratory effort. no wheeze, no rhonchi, no rales  Cardiovascular: S1/S2 normal, no murmur, no rub/gallop auscultated. RRR. No lower extremity edema.  Gastrointestinal: Nontender, no masses. No hepatomegaly, no splenomegaly. No hernia appreciated. Bowel sounds normal. Rectal exam deferred.   Musculoskeletal: Gait normal. No clubbing/cyanosis of digits.   Neurological: Normal balance/coordination. No tremor.   Skin: warm, dry, intact. No rash/ulcer.  No concerning nevi or subq nodules on limited exam.    Psychiatric: Normal judgment/insight. Normal mood and affect. Oriented x3.          ASSESSMENT/PLAN: The primary encounter diagnosis was Osteopenia, unspecified location. Diagnoses of Postmenopausal, Essential hypertension, and Anxiety state were also pertinent to this visit.    Orders Placed This Encounter  Procedures  . HM DEXA SCAN  . COMPLETE METABOLIC PANEL WITH GFR  . CBC with Differential/Platelet  . Lipid panel  . TSH  . ABN Test Refusal    Meds ordered this encounter  Medications  . Tdap (ADACEL) 12-01-13.5 LF-MCG/0.5 injection    Sig: Inject 0.5 mLs into the muscle once for 1 dose.    Dispense:  0.5 mL    Refill:  0  . Zoster Vaccine Adjuvanted [facility]) injection    Sig: Inject 0.5 mLs into the muscle once for 1 dose. Repeat in 2-6 months. Please fax confirmation of vaccination to Dr Sheppard Coil [phone]    Dispense:  0.5 mL    Refill:  1    Patient Instructions  General Preventive Care  Most recent routine screening lipids/other labs:   Tobacco: don't!   Alcohol: responsible moderation is ok for most adults - if you have concerns about your alcohol intake, please talk to me!   Exercise: as tolerated to reduce risk of cardiovascular disease and diabetes. Strength training will also prevent osteoporosis.   Mental health: if need for mental health care (medicines, counseling, other), or concerns about moods, please let me know!   Sexual health: if need for STD testing, or if concerns with libido/pain problems, please let me know!  Advanced Directive: Living Will and/or Healthcare Power of Attorney recommended for all adults, regardless of age or health.  Vaccines  Flu vaccine: recommended for almost everyone, every fall.   Shingles vaccine: Shingrix recommended after age 71. Rx to pharmacy - medicare won't let us do it here!    Pneumonia vaccines: done!   Tetanus booster: Tdap recommended  every 10 years. Rx to pharmacy - medicare won't let us do it here!   Cancer screenings   Colon cancer screening: Cologuard due next year   Breast cancer screening: mammogram due - please call [facility]!  Cervical cancer screening: Pap - Can usually stop at age 72 or w/ hysterectomy.   Lung cancer screening: not needed for nonsmokers  Infection screenings . HIV, Gonorrhea/Chlamydia: screening as needed . Hepatitis C: recommended for anyone born 37-1963 . TB: certain at-risk populations, or depending on work requirements and/or travel history Other . Bone Density Test: recommended for women at age 57, men at age 42, sooner depending on risk factors . Abdominal Aortic Aneurysm: screening with ultrasound recommended once for men age 14-75 who have ever smoked         Visit summary with medication list and pertinent instructions was printed for patient to review. All questions at time of visit were answered - patient instructed to contact office with any additional concerns or updates. ER/RTC precautions were  reviewed with the patient.   Note: Total time spent 25 minutes, greater than 50% of the visit was spent face-to-face counseling and coordinating care for the above diagnoses listed in assessment/plan.   Please note: voice recognition software was used to produce this document, and typos may escape review. Please contact Dr. Sheppard Coil for any needed clarifications.     Follow-up plan: Return for ANNUAL (call week prior to visit for lab orders).

## 2019-04-17 LAB — TSH: TSH: 1.66 mIU/L (ref 0.40–4.50)

## 2019-04-17 LAB — COMPLETE METABOLIC PANEL WITH GFR
AG Ratio: 1.8 (calc) (ref 1.0–2.5)
ALT: 24 U/L (ref 6–29)
AST: 27 U/L (ref 10–35)
Albumin: 4.3 g/dL (ref 3.6–5.1)
Alkaline phosphatase (APISO): 66 U/L (ref 37–153)
BUN: 17 mg/dL (ref 7–25)
CO2: 25 mmol/L (ref 20–32)
Calcium: 9.7 mg/dL (ref 8.6–10.4)
Chloride: 101 mmol/L (ref 98–110)
Creat: 0.65 mg/dL (ref 0.60–0.93)
GFR, Est African American: 104 mL/min/{1.73_m2} (ref >=60)
GFR, Est Non African American: 89 mL/min/{1.73_m2} (ref >=60)
Globulin: 2.4 g/dL (calc) (ref 1.9–3.7)
Glucose, Bld: 92 mg/dL (ref 65–99)
Potassium: 4 mmol/L (ref 3.5–5.3)
Sodium: 138 mmol/L (ref 135–146)
Total Bilirubin: 0.7 mg/dL (ref 0.2–1.2)
Total Protein: 6.7 g/dL (ref 6.1–8.1)

## 2019-04-17 LAB — CBC WITH DIFFERENTIAL/PLATELET
Absolute Monocytes: 488 cells/uL (ref 200–950)
Basophils Absolute: 40 cells/uL (ref 0–200)
Basophils Relative: 0.9 %
Eosinophils Absolute: 31 cells/uL (ref 15–500)
Eosinophils Relative: 0.7 %
HCT: 41.9 % (ref 35.0–45.0)
Hemoglobin: 14.3 g/dL (ref 11.7–15.5)
Lymphs Abs: 1241 cells/uL (ref 850–3900)
MCH: 32.6 pg (ref 27.0–33.0)
MCHC: 34.1 g/dL (ref 32.0–36.0)
MCV: 95.7 fL (ref 80.0–100.0)
MPV: 10.6 fL (ref 7.5–12.5)
Monocytes Relative: 11.1 %
Neutro Abs: 2600 cells/uL (ref 1500–7800)
Neutrophils Relative %: 59.1 %
Platelets: 222 10*3/uL (ref 140–400)
RBC: 4.38 10*6/uL (ref 3.80–5.10)
RDW: 12 % (ref 11.0–15.0)
Total Lymphocyte: 28.2 %
WBC: 4.4 10*3/uL (ref 3.8–10.8)

## 2019-04-17 LAB — ABN TEST REFUSAL

## 2019-04-24 DIAGNOSIS — H353211 Exudative age-related macular degeneration, right eye, with active choroidal neovascularization: Secondary | ICD-10-CM | POA: Diagnosis not present

## 2019-04-24 DIAGNOSIS — H353122 Nonexudative age-related macular degeneration, left eye, intermediate dry stage: Secondary | ICD-10-CM | POA: Diagnosis not present

## 2019-04-24 DIAGNOSIS — H43813 Vitreous degeneration, bilateral: Secondary | ICD-10-CM | POA: Diagnosis not present

## 2019-04-24 DIAGNOSIS — H25813 Combined forms of age-related cataract, bilateral: Secondary | ICD-10-CM | POA: Diagnosis not present

## 2019-05-03 ENCOUNTER — Other Ambulatory Visit: Payer: Self-pay | Admitting: Osteopathic Medicine

## 2019-05-03 ENCOUNTER — Telehealth: Payer: Self-pay

## 2019-05-03 DIAGNOSIS — M810 Age-related osteoporosis without current pathological fracture: Secondary | ICD-10-CM

## 2019-05-03 DIAGNOSIS — R921 Mammographic calcification found on diagnostic imaging of breast: Secondary | ICD-10-CM

## 2019-05-03 NOTE — Telephone Encounter (Signed)
Pt called requesting a referral for Bone Density testing. As per patient, she is due for one and forgot to mention it to Dr. Sheppard Coil at her last visit.

## 2019-05-04 NOTE — Telephone Encounter (Signed)
Ordered

## 2019-05-04 NOTE — Telephone Encounter (Signed)
Left a detailed vm msg for pt regarding Bone Density referral. Direct call back info provided.

## 2019-05-04 NOTE — Telephone Encounter (Signed)
Please order DEXA

## 2019-05-11 ENCOUNTER — Ambulatory Visit
Admission: RE | Admit: 2019-05-11 | Discharge: 2019-05-11 | Disposition: A | Discharge status: Home / Self Care | Payer: Medicare Other | Source: Ambulatory Visit | Attending: Osteopathic Medicine | Admitting: Osteopathic Medicine

## 2019-05-11 ENCOUNTER — Other Ambulatory Visit: Payer: Self-pay

## 2019-05-11 DIAGNOSIS — R921 Mammographic calcification found on diagnostic imaging of breast: Secondary | ICD-10-CM

## 2019-05-16 ENCOUNTER — Other Ambulatory Visit: Payer: Self-pay

## 2019-05-16 ENCOUNTER — Ambulatory Visit (INDEPENDENT_AMBULATORY_CARE_PROVIDER_SITE_OTHER): Payer: Medicare Other

## 2019-05-16 DIAGNOSIS — M8589 Other specified disorders of bone density and structure, multiple sites: Secondary | ICD-10-CM | POA: Diagnosis not present

## 2019-05-16 DIAGNOSIS — M810 Age-related osteoporosis without current pathological fracture: Secondary | ICD-10-CM | POA: Diagnosis not present

## 2019-05-16 DIAGNOSIS — Z780 Asymptomatic menopausal state: Secondary | ICD-10-CM | POA: Diagnosis not present

## 2019-06-06 DIAGNOSIS — H353122 Nonexudative age-related macular degeneration, left eye, intermediate dry stage: Secondary | ICD-10-CM | POA: Diagnosis not present

## 2019-06-06 DIAGNOSIS — H25813 Combined forms of age-related cataract, bilateral: Secondary | ICD-10-CM | POA: Diagnosis not present

## 2019-06-06 DIAGNOSIS — H353211 Exudative age-related macular degeneration, right eye, with active choroidal neovascularization: Secondary | ICD-10-CM | POA: Diagnosis not present

## 2019-06-06 DIAGNOSIS — H43813 Vitreous degeneration, bilateral: Secondary | ICD-10-CM | POA: Diagnosis not present

## 2019-08-21 DIAGNOSIS — H2513 Age-related nuclear cataract, bilateral: Secondary | ICD-10-CM | POA: Diagnosis not present

## 2019-08-21 DIAGNOSIS — H35361 Drusen (degenerative) of macula, right eye: Secondary | ICD-10-CM | POA: Diagnosis not present

## 2019-08-21 DIAGNOSIS — H25013 Cortical age-related cataract, bilateral: Secondary | ICD-10-CM | POA: Diagnosis not present

## 2019-09-12 DIAGNOSIS — H353211 Exudative age-related macular degeneration, right eye, with active choroidal neovascularization: Secondary | ICD-10-CM | POA: Diagnosis not present

## 2019-09-12 DIAGNOSIS — H43813 Vitreous degeneration, bilateral: Secondary | ICD-10-CM | POA: Diagnosis not present

## 2019-09-12 DIAGNOSIS — H353122 Nonexudative age-related macular degeneration, left eye, intermediate dry stage: Secondary | ICD-10-CM | POA: Diagnosis not present

## 2019-09-12 DIAGNOSIS — H25813 Combined forms of age-related cataract, bilateral: Secondary | ICD-10-CM | POA: Diagnosis not present

## 2019-10-25 ENCOUNTER — Encounter: Payer: Self-pay | Admitting: Family Medicine

## 2019-10-25 ENCOUNTER — Ambulatory Visit (INDEPENDENT_AMBULATORY_CARE_PROVIDER_SITE_OTHER): Payer: Medicare Other | Admitting: Family Medicine

## 2019-10-25 ENCOUNTER — Other Ambulatory Visit: Payer: Self-pay

## 2019-10-25 VITALS — BP 132/68 | HR 71 | Ht 61.0 in | Wt 105.0 lb

## 2019-10-25 DIAGNOSIS — R030 Elevated blood-pressure reading, without diagnosis of hypertension: Secondary | ICD-10-CM | POA: Diagnosis not present

## 2019-10-25 DIAGNOSIS — R1032 Left lower quadrant pain: Secondary | ICD-10-CM

## 2019-10-25 MED ORDER — AMOXICILLIN-POT CLAVULANATE 875-125 MG PO TABS: 1.0000 | ORAL_TABLET | Freq: Two times a day (BID) | ORAL | 0 refills | Status: DC

## 2019-10-25 NOTE — Addendum Note (Signed)
Addended by: Beatrice Lecher D on: 10/25/2019 11:11 AM   Modules accepted: Orders

## 2019-10-25 NOTE — Progress Notes (Addendum)
Established Patient Office Visit  Subjective:  Patient ID: Valerie Dixon, female    DOB: 1945-08-05  Age: 74 y.o. MRN: [id number]  CC:  Chief Complaint  Patient presents with  . Abdominal Pain    HPI [name] presents for left abdominal pain that started on Sunday about 5 days ago.  He said she had eaten dinner and just a little while after that started to get some intense pain in the left lower quadrant.  She thought maybe it was just gas from eating but never really could pass gas.  She is says that night it was persistent but then afterwards it would just come and go and then it started radiating across the lower abdomen.  She does still have an appendix.  Had a normal BM yesterday and today but before that weren't normal.  It feels tender.  Does feels some better today. No fever, sweats or chills, N/V/D. Took amoxicillin 500mg  starting Sunday night.  It was left over from one of her husband's treatments.  Says she has been on it for about 3-1/2 days at this point.  She does feel like she is gradually gotten a little bit better.  She has not noticed any blood in her stool.  She still feels like there is some tenderness in the lower abdomen.  Again it initially started on the left side but now, radiates across.  She says even just check position change she notices little discomfort.  Appetite has been okay she has not felt nauseated.  Past Medical History:  Diagnosis Date  . Osteoporosis 03/23/2017   DEXA 03/2017    Past Surgical History:  Procedure Laterality Date  . TONSILLECTOMY      Family History  Problem Relation Age of Onset  . Kidney Stones Mother   . Cancer Father     Social History   Socioeconomic History  . Marital status: Married    Spouse name: Not on file  . Number of children: Not on file  . Years of education: Not on file  . Highest education level: Not on file  Occupational History  . Not on file  Tobacco Use  . Smoking status: Never  Smoker  . Smokeless tobacco: Never Used  Substance and Sexual Activity  . Alcohol use: No  . Drug use: No  . Sexual activity: Yes    Birth control/protection: None  Other Topics Concern  . Not on file  Social History Narrative  . Not on file   Social Determinants of Health   Financial Resource Strain:   . Difficulty of Paying Living Expenses:   Food Insecurity:   . Worried About Charity fundraiser in the Last Year:   . Arboriculturist in the Last Year:   Transportation Needs:   . Film/video editor (Medical):   Marland Kitchen Lack of Transportation (Non-Medical):   Physical Activity:   . Days of Exercise per Week:   . Minutes of Exercise per Session:   Stress:   . Feeling of Stress :   Social Connections:   . Frequency of Communication with Friends and Family:   . Frequency of Social Gatherings with Friends and Family:   . Attends Religious Services:   . Active Member of Clubs or Organizations:   . Attends Archivist Meetings:   Marland Kitchen Marital Status:   Intimate Partner Violence:   . Fear of Current or Ex-Partner:   . Emotionally Abused:   Marland Kitchen Physically  Abused:   . Sexually Abused:     Outpatient Medications Prior to Visit  Medication Sig Dispense Refill  . Calcium-Magnesium-Vitamin D (CALCIUM 500 PO) Take by mouth 2 (two) times daily.    . Cholecalciferol (VITAMIN D-3) 1000 UNITS CAPS Take by mouth.    . estradiol (ESTRACE) 0.1 MG/GM vaginal cream Place vaginally.    . Lutein-Zeaxanthin 15-0.7 MG CAPS Take by mouth.    . Magnesium 400 MG CAPS Take by mouth.    . Melatonin 3 MG CAPS Take by mouth at bedtime.    . Methenamine-Sodium Salicylate (CYSTEX PO) Take by mouth.    . Misc Natural Products (PUMPKIN SEED OIL PO) Take by mouth.    . Multiple Vitamins-Minerals (EYE VITAMINS PO) Take by mouth.    . Omega-3 Fatty Acids (OMEGA 3 PO) Take by mouth daily.    Marland Kitchen conjugated estrogens (PREMARIN) vaginal cream Place 1 Applicatorful vaginally once a week. 42.5 g 12  .  LUTEIN-ZEAXANTHIN PO Take by mouth.    . Phenazopyridine HCl (AZO TABS PO) Take by mouth.    . Probiotic Product (PROBIOTIC DAILY PO) Take by mouth.     No facility-administered medications prior to visit.    Allergies  Allergen Reactions  . Celecoxib Swelling    ROS Review of Systems    Objective:    Physical Exam  Constitutional: She is oriented to person, place, and time. She appears well-developed and well-nourished.  HENT:  Head: Normocephalic and atraumatic.  Cardiovascular: Normal rate, regular rhythm and normal heart sounds.  Pulmonary/Chest: Effort normal and breath sounds normal.  Abdominal: Soft. Bowel sounds are normal. She exhibits no distension and no mass. There is abdominal tenderness. There is no rebound and no guarding.  Mild tenderness in the suprapubic area  Neurological: She is alert and oriented to person, place, and time.  Skin: Skin is warm and dry.  Psychiatric: She has a normal mood and affect. Her behavior is normal.    BP 132/68   Pulse 71   Ht 5\' 1"  (1.549 m)   Wt 105 lb (47.6 kg)   SpO2 100%   BMI 19.84 kg/m  Wt Readings from Last 3 Encounters:  10/25/19 105 lb (47.6 kg)  04/16/19 102 lb 3.2 oz (46.4 kg)  01/03/19 104 lb (47.2 kg)     Health Maintenance Due  Topic Date Due  . TETANUS/TDAP  Never done  . PNA vac Low Risk Adult (2 of 2 - PCV13) 04/06/2018    There are no preventive care reminders to display for this patient.  Lab Results  Component Value Date   TSH 1.66 04/16/2019   Lab Results  Component Value Date   WBC 4.4 04/16/2019   HGB 14.3 04/16/2019   HCT 41.9 04/16/2019   MCV 95.7 04/16/2019   PLT 222 04/16/2019   Lab Results  Component Value Date   NA 138 04/16/2019   K 4.0 04/16/2019   CO2 25 04/16/2019   GLUCOSE 92 04/16/2019   BUN 17 04/16/2019   CREATININE 0.65 04/16/2019   BILITOT 0.7 04/16/2019   ALKPHOS 71 03/09/2017   AST 27 04/16/2019   ALT 24 04/16/2019   PROT 6.7 04/16/2019   ALBUMIN 4.5  03/09/2017   CALCIUM 9.7 04/16/2019   Lab Results  Component Value Date   CHOL 245 (H) 03/09/2017   Lab Results  Component Value Date   HDL 105 03/09/2017   Lab Results  Component Value Date   LDLCALC 125 (H) 03/09/2017  Lab Results  Component Value Date   TRIG 75 03/09/2017   Lab Results  Component Value Date   CHOLHDL 2.3 03/09/2017   Lab Results  Component Value Date   HGBA1C 5.2 04/06/2018      Assessment & Plan:   Problem List Items Addressed This Visit    None    Visit Diagnoses    LLQ pain    -  Primary   Relevant Orders   COMPLETE METABOLIC PANEL WITH GFR   CBC with Differential/Platelet   Urinalysis with Culture Reflex   Elevated blood pressure reading in office with white coat syndrome, without diagnosis of hypertension       Relevant Orders   COMPLETE METABOLIC PANEL WITH GFR   CBC with Differential/Platelet   Urinalysis with Culture Reflex     Left lower quadrant pain radiating across the lower abdomen-suspect diverticulitis.  We will get some additional labs including a CBC with differential.  She actually has been on antibiotics for about 3-1/2 days which does complicate matters and she does feel like she is actually been getting a little bit better and makes it difficult to tell if the antibiotics are actually helping her or if she just would have gotten better on her own especially now that her stools seem to be moving a little bit more normally.  If her white blood cell count is significantly elevated then we will move forward with CT of the abdomen pelvis and I did discuss that with her today.  Or if her pain just does not completely resolve after the weekend then she will let me know and we will do imaging at that point.  In the meantime I am going to go ahead and send over antibiotic for her to be able to complete her course that she actually started.  She reports that she has never had a colonoscopy but did do Cologuard and was told that it was  normal.  She has a little bit of suprapubic pain today I will do a urinalysis even though she is not having any additional urinary symptoms.  Meds ordered this encounter  Medications  . amoxicillin-clavulanate (AUGMENTIN) 875-125 MG tablet    Sig: Take 1 tablet by mouth 2 (two) times daily.    Dispense:  10 tablet    Refill:  0    Follow-up: Return if symptoms worsen or fail to improve.    Beatrice Lecher, MD

## 2019-10-26 LAB — URINALYSIS W MICROSCOPIC + REFLEX CULTURE
Bacteria, UA: NONE SEEN /HPF
Bilirubin Urine: NEGATIVE
Glucose, UA: NEGATIVE
Hgb urine dipstick: NEGATIVE
Hyaline Cast: NONE SEEN /LPF
Ketones, ur: NEGATIVE
Leukocyte Esterase: NEGATIVE
Nitrites, Initial: NEGATIVE
Protein, ur: NEGATIVE
RBC / HPF: NONE SEEN /HPF (ref 0–2)
Specific Gravity, Urine: 1.008 (ref 1.001–1.03)
Squamous Epithelial / HPF: NONE SEEN /HPF (ref <=5)
WBC, UA: NONE SEEN /HPF (ref 0–5)
pH: 6 (ref 5.0–8.0)

## 2019-10-26 LAB — COMPLETE METABOLIC PANEL WITH GFR
AG Ratio: 1.8 (calc) (ref 1.0–2.5)
ALT: 21 U/L (ref 6–29)
AST: 25 U/L (ref 10–35)
Albumin: 4.2 g/dL (ref 3.6–5.1)
Alkaline phosphatase (APISO): 75 U/L (ref 37–153)
BUN: 22 mg/dL (ref 7–25)
CO2: 29 mmol/L (ref 20–32)
Calcium: 10 mg/dL (ref 8.6–10.4)
Chloride: 99 mmol/L (ref 98–110)
Creat: 0.72 mg/dL (ref 0.60–0.93)
GFR, Est African American: 98 mL/min/{1.73_m2} (ref >=60)
GFR, Est Non African American: 84 mL/min/{1.73_m2} (ref >=60)
Globulin: 2.4 g/dL (calc) (ref 1.9–3.7)
Glucose, Bld: 96 mg/dL (ref 65–139)
Potassium: 4.9 mmol/L (ref 3.5–5.3)
Sodium: 137 mmol/L (ref 135–146)
Total Bilirubin: 0.5 mg/dL (ref 0.2–1.2)
Total Protein: 6.6 g/dL (ref 6.1–8.1)

## 2019-10-26 LAB — CBC WITH DIFFERENTIAL/PLATELET
Absolute Monocytes: 611 cells/uL (ref 200–950)
Basophils Absolute: 59 cells/uL (ref 0–200)
Basophils Relative: 0.9 %
Eosinophils Absolute: 39 cells/uL (ref 15–500)
Eosinophils Relative: 0.6 %
HCT: 42.2 % (ref 35.0–45.0)
Hemoglobin: 14.3 g/dL (ref 11.7–15.5)
Lymphs Abs: 1164 cells/uL (ref 850–3900)
MCH: 32.2 pg (ref 27.0–33.0)
MCHC: 33.9 g/dL (ref 32.0–36.0)
MCV: 95 fL (ref 80.0–100.0)
MPV: 9.7 fL (ref 7.5–12.5)
Monocytes Relative: 9.4 %
Neutro Abs: 4628 cells/uL (ref 1500–7800)
Neutrophils Relative %: 71.2 %
Platelets: 274 10*3/uL (ref 140–400)
RBC: 4.44 10*6/uL (ref 3.80–5.10)
RDW: 11.8 % (ref 11.0–15.0)
Total Lymphocyte: 17.9 %
WBC: 6.5 10*3/uL (ref 3.8–10.8)

## 2019-10-26 LAB — NO CULTURE INDICATED

## 2019-10-30 DIAGNOSIS — H353211 Exudative age-related macular degeneration, right eye, with active choroidal neovascularization: Secondary | ICD-10-CM | POA: Diagnosis not present

## 2019-10-30 DIAGNOSIS — H353122 Nonexudative age-related macular degeneration, left eye, intermediate dry stage: Secondary | ICD-10-CM | POA: Diagnosis not present

## 2019-10-30 DIAGNOSIS — H25813 Combined forms of age-related cataract, bilateral: Secondary | ICD-10-CM | POA: Diagnosis not present

## 2019-11-06 ENCOUNTER — Encounter: Payer: Self-pay | Admitting: Family Medicine

## 2019-11-06 ENCOUNTER — Ambulatory Visit (INDEPENDENT_AMBULATORY_CARE_PROVIDER_SITE_OTHER): Payer: Medicare Other | Admitting: Family Medicine

## 2019-11-06 ENCOUNTER — Other Ambulatory Visit: Payer: Self-pay

## 2019-11-06 DIAGNOSIS — R1032 Left lower quadrant pain: Secondary | ICD-10-CM

## 2019-11-06 NOTE — Progress Notes (Signed)
Pt reports that she she has a dull ache and that it radiates down both her legs

## 2019-11-06 NOTE — Progress Notes (Signed)
Established Patient Office Visit  Subjective:  Patient ID: Valerie Dixon, female    DOB: [birth year]  Age: 74 y.o. MRN: [id number]  CC:  Chief Complaint  Patient presents with  . Abdominal Pain    HPI Valerie Dixon presents for left lower abdominal pain.  I saw her approximately a week and a half ago.  At that time she already had pain for about 5 days.  She already been on antibiotics for about 3-1/2 days that she had started on her own at home.  I started her on Augmentin so that she could complete the antibiotics that she had actually already started she did not have enough to do a full course.  She had a normal metabolic panel and blood count was normal.  Urinalysis was also normal.  And encouraged her to return or call if she felt like her  symptoms persisted or got worse.  Stays still has some mild lower abdominal pain that radiates into her thighs.  Feels like her appetite is good. Has been taking a fiber supplement.  No pain over eats. Normal BM this AM.  She wonders if could be a gyn problem but saw gyn last year.  No fever, sweats or chills.    Has tightness in her rectum after a BM.  As far as the pain in the left lower quadrant is not worse or better with a bowel movement.  Past Medical History:  Diagnosis Date  . Osteoporosis 03/23/2017   DEXA 03/2017    Past Surgical History:  Procedure Laterality Date  . TONSILLECTOMY      Family History  Problem Relation Age of Onset  . Kidney Stones Mother   . Cancer Father     Social History   Socioeconomic History  . Marital status: Married    Spouse name: Not on file  . Number of children: Not on file  . Years of education: Not on file  . Highest education level: Not on file  Occupational History  . Not on file  Tobacco Use  . Smoking status: Never Smoker  . Smokeless tobacco: Never Used  Substance and Sexual Activity  . Alcohol use: No  . Drug use: No  . Sexual activity: Yes    Birth  control/protection: None  Other Topics Concern  . Not on file  Social History Narrative  . Not on file   Social Determinants of Health   Financial Resource Strain:   . Difficulty of Paying Living Expenses:   Food Insecurity:   . Worried About Charity fundraiser in the Last Year:   . Arboriculturist in the Last Year:   Transportation Needs:   . Film/video editor (Medical):   Marland Kitchen Lack of Transportation (Non-Medical):   Physical Activity:   . Days of Exercise per Week:   . Minutes of Exercise per Session:   Stress:   . Feeling of Stress :   Social Connections:   . Frequency of Communication with Friends and Family:   . Frequency of Social Gatherings with Friends and Family:   . Attends Religious Services:   . Active Member of Clubs or Organizations:   . Attends Archivist Meetings:   Marland Kitchen Marital Status:   Intimate Partner Violence:   . Fear of Current or Ex-Partner:   . Emotionally Abused:   Marland Kitchen Physically Abused:   . Sexually Abused:     Outpatient Medications Prior to Visit  Medication Sig Dispense  Refill  . Calcium-Magnesium-Vitamin D (CALCIUM 500 PO) Take by mouth 2 (two) times daily.    . Cholecalciferol (VITAMIN D-3) 1000 UNITS CAPS Take by mouth.    . estradiol (ESTRACE) 0.1 MG/GM vaginal cream Place vaginally.    . Lutein-Zeaxanthin 15-0.7 MG CAPS Take by mouth.    . Magnesium 400 MG CAPS Take by mouth.    . Melatonin 10 MG CAPS Take by mouth at bedtime.     Marland Kitchen amoxicillin-clavulanate (AUGMENTIN) 875-125 MG tablet Take 1 tablet by mouth 2 (two) times daily. 10 tablet 0  . Methenamine-Sodium Salicylate (CYSTEX PO) Take by mouth.    . Misc Natural Products (PUMPKIN SEED OIL PO) Take by mouth.    . Multiple Vitamins-Minerals (EYE VITAMINS PO) Take by mouth.    . Omega-3 Fatty Acids (OMEGA 3 PO) Take by mouth daily.     No facility-administered medications prior to visit.    Allergies  Allergen Reactions  . Celecoxib Swelling    ROS Review of  Systems    Objective:    Physical Exam  Constitutional: She is oriented to person, place, and time. She appears well-developed and well-nourished.  HENT:  Head: Normocephalic and atraumatic.  Cardiovascular: Normal rate, regular rhythm and normal heart sounds.  Pulmonary/Chest: Effort normal and breath sounds normal.  Abdominal: Soft. Bowel sounds are normal. She exhibits no distension and no mass. There is abdominal tenderness. There is no rebound and no guarding.  Tenderness in the left mid abdomen just lateral to the umbilicus.  Neurological: She is alert and oriented to person, place, and time.  Skin: Skin is warm and dry.  Psychiatric: She has a normal mood and affect. Her behavior is normal.    BP 138/78   Pulse 69   Ht 5\' 1"  (1.549 m)   Wt 105 lb (47.6 kg)   SpO2 98%   BMI 19.84 kg/m  Wt Readings from Last 3 Encounters:  11/06/19 105 lb (47.6 kg)  10/25/19 105 lb (47.6 kg)  04/16/19 102 lb 3.2 oz (46.4 kg)     Health Maintenance Due  Topic Date Due  . TETANUS/TDAP  Never done  . PNA vac Low Risk Adult (2 of 2 - PCV13) 04/06/2018    There are no preventive care reminders to display for this patient.  Lab Results  Component Value Date   TSH 1.66 04/16/2019   Lab Results  Component Value Date   WBC 6.5 10/25/2019   HGB 14.3 10/25/2019   HCT 42.2 10/25/2019   MCV 95.0 10/25/2019   PLT 274 10/25/2019   Lab Results  Component Value Date   NA 137 10/25/2019   K 4.9 10/25/2019   CO2 29 10/25/2019   GLUCOSE 96 10/25/2019   BUN 22 10/25/2019   CREATININE 0.72 10/25/2019   BILITOT 0.5 10/25/2019   ALKPHOS 71 03/09/2017   AST 25 10/25/2019   ALT 21 10/25/2019   PROT 6.6 10/25/2019   ALBUMIN 4.5 03/09/2017   CALCIUM 10.0 10/25/2019   Lab Results  Component Value Date   CHOL 245 (H) 03/09/2017   Lab Results  Component Value Date   HDL 105 03/09/2017   Lab Results  Component Value Date   LDLCALC 125 (H) 03/09/2017   Lab Results  Component Value  Date   TRIG 75 03/09/2017   Lab Results  Component Value Date   CHOLHDL 2.3 03/09/2017   Lab Results  Component Value Date   HGBA1C 5.2 04/06/2018  Assessment & Plan:   Problem List Items Addressed This Visit    None    Visit Diagnoses    Left lower quadrant abdominal pain       Relevant Orders   CT Abdomen Pelvis W Contrast     Even though she is significantly better she still having an mild aching in that left lower quadrant which is still a little unusual.  She still has her ovaries and uterus.  We discussed options including moving forward with a CT abdomen pelvis for further work-up.  She wonders if it could be a female issue we did discuss the possibility of an ultrasound instead of a CT but would not look at the bowel or the bone.  Continue with psyllium since this seem to be helping her bowels move more regularly.  She does not feel particularly constipated.  She has had Cologuard screening but not a colonoscopy.  He did let me know she completed her Covid vaccination series.  No orders of the defined types were placed in this encounter.   Follow-up: Return if symptoms worsen or fail to improve.    Beatrice Lecher, MD

## 2019-11-12 ENCOUNTER — Encounter: Payer: Self-pay | Admitting: Family Medicine

## 2019-11-12 ENCOUNTER — Ambulatory Visit (INDEPENDENT_AMBULATORY_CARE_PROVIDER_SITE_OTHER): Payer: Medicare Other

## 2019-11-12 ENCOUNTER — Other Ambulatory Visit: Payer: Self-pay

## 2019-11-12 DIAGNOSIS — K5730 Diverticulosis of large intestine without perforation or abscess without bleeding: Secondary | ICD-10-CM | POA: Diagnosis not present

## 2019-11-12 DIAGNOSIS — R1032 Left lower quadrant pain: Secondary | ICD-10-CM | POA: Diagnosis not present

## 2019-11-12 DIAGNOSIS — I728 Aneurysm of other specified arteries: Secondary | ICD-10-CM | POA: Insufficient documentation

## 2019-11-12 MED ORDER — IOHEXOL 300 MG/ML  SOLN
100.0000 mL | Freq: Once | INTRAMUSCULAR | Status: AC | PRN
  Administered 2019-11-12: 100 mL via INTRAVENOUS

## 2019-11-14 ENCOUNTER — Other Ambulatory Visit: Payer: Self-pay | Admitting: Family Medicine

## 2019-11-14 MED ORDER — AMOXICILLIN-POT CLAVULANATE 875-125 MG PO TABS: 1.0000 | ORAL_TABLET | Freq: Two times a day (BID) | ORAL | 0 refills | Status: DC

## 2019-12-12 DIAGNOSIS — H25813 Combined forms of age-related cataract, bilateral: Secondary | ICD-10-CM | POA: Diagnosis not present

## 2019-12-12 DIAGNOSIS — H353211 Exudative age-related macular degeneration, right eye, with active choroidal neovascularization: Secondary | ICD-10-CM | POA: Diagnosis not present

## 2019-12-12 DIAGNOSIS — H353122 Nonexudative age-related macular degeneration, left eye, intermediate dry stage: Secondary | ICD-10-CM | POA: Diagnosis not present

## 2020-02-18 DIAGNOSIS — H25013 Cortical age-related cataract, bilateral: Secondary | ICD-10-CM | POA: Diagnosis not present

## 2020-02-18 DIAGNOSIS — H2513 Age-related nuclear cataract, bilateral: Secondary | ICD-10-CM | POA: Diagnosis not present

## 2020-02-18 DIAGNOSIS — H00014 Hordeolum externum left upper eyelid: Secondary | ICD-10-CM | POA: Diagnosis not present

## 2020-03-18 DIAGNOSIS — H353122 Nonexudative age-related macular degeneration, left eye, intermediate dry stage: Secondary | ICD-10-CM | POA: Diagnosis not present

## 2020-03-18 DIAGNOSIS — H353211 Exudative age-related macular degeneration, right eye, with active choroidal neovascularization: Secondary | ICD-10-CM | POA: Diagnosis not present

## 2020-04-08 ENCOUNTER — Encounter: Payer: Self-pay | Admitting: Osteopathic Medicine

## 2020-04-16 ENCOUNTER — Encounter: Payer: Medicare Other | Admitting: Osteopathic Medicine

## 2020-05-13 DIAGNOSIS — H25813 Combined forms of age-related cataract, bilateral: Secondary | ICD-10-CM | POA: Diagnosis not present

## 2020-05-13 DIAGNOSIS — H353211 Exudative age-related macular degeneration, right eye, with active choroidal neovascularization: Secondary | ICD-10-CM | POA: Diagnosis not present

## 2020-05-13 DIAGNOSIS — H353122 Nonexudative age-related macular degeneration, left eye, intermediate dry stage: Secondary | ICD-10-CM | POA: Diagnosis not present

## 2020-05-13 DIAGNOSIS — H43813 Vitreous degeneration, bilateral: Secondary | ICD-10-CM | POA: Diagnosis not present

## 2020-05-15 ENCOUNTER — Ambulatory Visit (INDEPENDENT_AMBULATORY_CARE_PROVIDER_SITE_OTHER): Payer: Medicare Other | Admitting: Osteopathic Medicine

## 2020-05-15 VITALS — BP 189/81 | HR 76 | Ht 61.0 in | Wt 105.0 lb

## 2020-05-15 DIAGNOSIS — R030 Elevated blood-pressure reading, without diagnosis of hypertension: Secondary | ICD-10-CM

## 2020-05-15 DIAGNOSIS — Z23 Encounter for immunization: Secondary | ICD-10-CM | POA: Diagnosis not present

## 2020-05-15 DIAGNOSIS — Z0000 Encounter for general adult medical examination without abnormal findings: Secondary | ICD-10-CM

## 2020-05-15 MED ORDER — ESTRADIOL 0.1 MG/GM VA CREA: 1.0000 | TOPICAL_CREAM | VAGINAL | 5 refills | Status: AC

## 2020-05-15 NOTE — Patient Instructions (Signed)
General Preventive Care  Most recent routine screening labs: ordered today.   Blood pressure goal 130/80 or less.   Tobacco: don't!  Alcohol: responsible moderation is ok for most adults - if you have concerns about your alcohol intake, please talk to me!   Exercise: as tolerated to reduce risk of cardiovascular disease and diabetes. Strength training will also prevent osteoporosis.   Mental health: if need for mental health care (medicines, counseling, other), or concerns about moods, please let me know!   Sexual / Reproductive health: if need for STD testing, or if concerns with libido/pain problems, please let me know!   Advanced Directive: Living Will and/or Healthcare Power of Attorney recommended for all adults, regardless of age or health.  Vaccines  Flu vaccine: every fall.   Shingles vaccine: after age 72. Please ask pharmacist about this if desired!  Pneumonia vaccine: done!   Tetanus booster: every 10 years. Please ask pharmacist about this if desired!   COVID vaccine: THANKS for getting your vaccine! :)  Cancer screenings   Colon cancer screening: for everyone age 74-75.   Breast cancer screening: mammogram 50-75 annually or every other year   Cervical cancer screening: Pap not needed after age 86 or w/ hysterectomy.   Lung cancer screening: not needed  Infection screenings  . HIV: recommended screening at least once age 41-65, more often as needed.  . Gonorrhea/Chlamydia: screening as needed . Hepatitis C: recommended once for everyone age 30-75 . TB: certain at-risk populations, or depending on work requirements and/or travel history Other . Bone Density Test: recommended for women at age 86

## 2020-05-15 NOTE — Progress Notes (Signed)
HPI: Valerie Dixon is a 74 y.o. female  who presents to [place] today, 05/15/20,  for Medicare Annual Wellness Exam  Patient presents for annual physical/Medicare wellness exam. No complaints today.   Past medical, surgical, social and family history reviewed:  Patient Active Problem List   Diagnosis Date Noted  . Splenic artery aneurysm ([place]) 11/12/2019  . Osteoporosis 03/23/2017  . Pelvic pressure in female 04/03/2015  . Urinary frequency 04/03/2015  . Nocturia 04/03/2015  . Papanicolaou smear for cervical cancer screening 04/03/2015  . White coat hypertension 04/03/2015    Past Surgical History:  Procedure Laterality Date  . TONSILLECTOMY      Social History   Socioeconomic History  . Marital status: Married    Spouse name: Not on file  . Number of children: Not on file  . Years of education: Not on file  . Highest education level: Not on file  Occupational History  . Not on file  Tobacco Use  . Smoking status: Never Smoker  . Smokeless tobacco: Never Used  Substance and Sexual Activity  . Alcohol use: No  . Drug use: No  . Sexual activity: Yes    Birth control/protection: None  Other Topics Concern  . Not on file  Social History Narrative  . Not on file   Social Determinants of Health   Financial Resource Strain:   . Difficulty of Paying Living Expenses: Not on file  Food Insecurity:   . Worried About Charity fundraiser in the Last Year: Not on file  . Ran Out of Food in the Last Year: Not on file  Transportation Needs:   . Lack of Transportation (Medical): Not on file  . Lack of Transportation (Non-Medical): Not on file  Physical Activity:   . Days of Exercise per Week: Not on file  . Minutes of Exercise per Session: Not on file  Stress:   . Feeling of Stress : Not on file  Social Connections:   . Frequency of Communication with Friends and Family: Not on file  . Frequency of Social Gatherings with  Friends and Family: Not on file  . Attends Religious Services: Not on file  . Active Member of Clubs or Organizations: Not on file  . Attends Archivist Meetings: Not on file  . Marital Status: Not on file  Intimate Partner Violence:   . Fear of Current or Ex-Partner: Not on file  . Emotionally Abused: Not on file  . Physically Abused: Not on file  . Sexually Abused: Not on file    Family History  Problem Relation Age of Onset  . Kidney Stones Mother   . Cancer Father      Current medication list and allergy/intolerance information reviewed:    Outpatient Encounter Medications as of 05/15/2020  Medication Sig  . Calcium-Magnesium-Vitamin D (CALCIUM 500 PO) Take by mouth 2 (two) times daily.  . Cholecalciferol (VITAMIN D-3) 1000 UNITS CAPS Take by mouth.  Derrill Memo ON 05/16/2020] estradiol (ESTRACE) 0.1 MG/GM vaginal cream Place 1 Applicatorful vaginally 3 (three) times a week.  . Lutein-Zeaxanthin 15-0.7 MG CAPS Take by mouth.  . Magnesium 400 MG CAPS Take by mouth.  . Melatonin 10 MG CAPS Take by mouth at bedtime.   . [DISCONTINUED] estradiol (ESTRACE) 0.1 MG/GM vaginal cream Place vaginally.  . [DISCONTINUED] amoxicillin-clavulanate (AUGMENTIN) 875-125 MG tablet Take 1 tablet by mouth 2 (two) times daily.   No facility-administered encounter medications on file as of  05/15/2020.    Allergies  Allergen Reactions  . Celecoxib Swelling       Review of Systems: Review of Systems - Negative    Medicare Wellness Questionnaire  Are there smokers in your home (other than you)? no  Depression Screen (Note: if answer to either of the following is "Yes", a more complete depression screening is indicated)   Q1: Over the past two weeks, have you felt down, depressed or hopeless? no  Q2: Over the past two weeks, have you felt little interest or pleasure in doing things? no  Have you lost interest or pleasure in daily life? no  Do you often feel hopeless? no  Do  you cry easily over simple problems? no  Activities of Daily Living In your present state of health, do you have any difficulty performing the following activities?:  Driving? no Managing money?  no Feeding yourself? no Getting from bed to chair? no Climbing a flight of stairs? no Preparing food and eating?: no Bathing or showering? no Getting dressed: no Getting to the toilet? no Using the toilet: no Moving around from place to place: no In the past year have you fallen or had a near fall?: no  Hearing Difficulties:  Do you often ask people to speak up or repeat themselves? no Do you experience ringing or noises in your ears? no  Do you have difficulty understanding soft or whispered voices? no  Memory Difficulties:  Do you feel that you have a problem with memory? yes  Do you often misplace items? no  Do you feel safe at home?  yes  Sexual Health:   Are you sexually active?  Yes  Do you have more than one partner?  No  Advanced Directives:   Advanced directives discussed: has NO advanced directive - not interested in additional information  Additional information provided: no  Risk Factors  Current exercise habits: active, walking, gardening   Dietary issues discussed: varied diet, no concerns, healthy protiens, vegetables   Cardiac risk factors: positive family history   Exam:  BP (!) 189/81   Pulse 76   Ht 5\' 1"  (1.549 m)   Wt 105 lb (47.6 kg)   SpO2 98%   BMI 19.84 kg/m   BP Readings from Last 3 Encounters:  05/15/20 (!) 189/81  11/06/19 138/78  10/25/19 132/68    Vision by Snellen chart: right eye:see nurse notes, left eye:see nurse notes  Constitutional: VS see above. General Appearance: alert, well-developed, well-nourished, NAD  Ears, Nose, Mouth, Throat: MMM  Neck: No masses, trachea midline.   Respiratory: Normal respiratory effort. no wheeze, no rhonchi, no rales  Cardiovascular:No lower extremity edema.   Musculoskeletal: Gait normal. No  clubbing/cyanosis of digits.   Neurological: Normal balance/coordination. No tremor. Recalls 3 objects and able to read face of watch with correct time.   Skin: warm, dry, intact. No rash/ulcer.   Psychiatric: Normal judgment/insight. Normal mood and affect. Oriented x3.     ASSESSMENT/PLAN:   Encounter for Medicare annual wellness exam  Needs flu shot - Plan: Flu Vaccine QUAD High Dose(Fluad)  Medicare annual wellness visit, subsequent  Elevated blood pressure reading - Plan: CBC, COMPLETE METABOLIC PANEL WITH GFR, Lipid Panel w/reflex Direct LDL  Patient Instructions  General Preventive Care  Most recent routine screening labs: ordered today.   Blood pressure goal 130/80 or less.   Tobacco: don't!  Alcohol: responsible moderation is ok for most adults - if you have concerns about your alcohol intake, please  talk to me!   Exercise: as tolerated to reduce risk of cardiovascular disease and diabetes. Strength training will also prevent osteoporosis.   Mental health: if need for mental health care (medicines, counseling, other), or concerns about moods, please let me know!   Sexual / Reproductive health: if need for STD testing, or if concerns with libido/pain problems, please let me know!   Advanced Directive: Living Will and/or Healthcare Power of Attorney recommended for all adults, regardless of age or health.  Vaccines  Flu vaccine: every fall.   Shingles vaccine: after age 7. Please ask pharmacist about this if desired!  Pneumonia vaccine: done!   Tetanus booster: every 10 years. Please ask pharmacist about this if desired!   COVID vaccine: THANKS for getting your vaccine! :)  Cancer screenings   Colon cancer screening: for everyone age 34-75.   Breast cancer screening: mammogram 50-75 annually or every other year   Cervical cancer screening: Pap not needed after age 42 or w/ hysterectomy.   Lung cancer screening: not needed  Infection screenings   . HIV: recommended screening at least once age 73-65, more often as needed.  . Gonorrhea/Chlamydia: screening as needed . Hepatitis C: recommended once for everyone age 48-75 . TB: certain at-risk populations, or depending on work requirements and/or travel history Other . Bone Density Test: recommended for women at age 76    During the course of the visit the patient was educated and counseled about appropriate screening and preventive services as noted above.   Patient Instructions (the written plan) was given to the patient.  Medicare Attestation I have personally reviewed: The patient's medical and social history Their use of alcohol, tobacco or illicit drugs Their current medications and supplements The patient's functional ability including ADLs,fall risks, home safety risks, cognitive, and hearing and visual impairment Diet and physical activities Evidence for depression or mood disorders  The patient's weight, height, BMI, and visual acuity have been recorded in the chart.  I have made referrals, counseling, and provided education to the patient based on review of the above and I have provided the patient with a written personalized care plan for preventive services.     Emeterio Reeve, DO   05/15/20   Visit summary with medication list and pertinent instructions was printed for patient to review. All questions at time of visit were answered - patient instructed to contact office with any additional concerns. ER/RTC precautions were reviewed with the patient. Follow-up plan: Return for  annual wellness in 1 year / sooner if needed or based on BP at home .

## 2020-05-16 DIAGNOSIS — R030 Elevated blood-pressure reading, without diagnosis of hypertension: Secondary | ICD-10-CM | POA: Diagnosis not present

## 2020-05-17 LAB — COMPLETE METABOLIC PANEL WITH GFR
AG Ratio: 1.8 (calc) (ref 1.0–2.5)
ALT: 20 U/L (ref 6–29)
AST: 23 U/L (ref 10–35)
Albumin: 4.2 g/dL (ref 3.6–5.1)
Alkaline phosphatase (APISO): 81 U/L (ref 37–153)
BUN: 20 mg/dL (ref 7–25)
CO2: 28 mmol/L (ref 20–32)
Calcium: 9.7 mg/dL (ref 8.6–10.4)
Chloride: 99 mmol/L (ref 98–110)
Creat: 0.73 mg/dL (ref 0.60–0.93)
GFR, Est African American: 95 mL/min/{1.73_m2} (ref >=60)
GFR, Est Non African American: 82 mL/min/{1.73_m2} (ref >=60)
Globulin: 2.4 g/dL (calc) (ref 1.9–3.7)
Glucose, Bld: 82 mg/dL (ref 65–139)
Potassium: 4.9 mmol/L (ref 3.5–5.3)
Sodium: 136 mmol/L (ref 135–146)
Total Bilirubin: 1.2 mg/dL (ref 0.2–1.2)
Total Protein: 6.6 g/dL (ref 6.1–8.1)

## 2020-05-17 LAB — CBC
HCT: 43.4 % (ref 35.0–45.0)
Hemoglobin: 14.9 g/dL (ref 11.7–15.5)
MCH: 33.4 pg — ABNORMAL HIGH (ref 27.0–33.0)
MCHC: 34.3 g/dL (ref 32.0–36.0)
MCV: 97.3 fL (ref 80.0–100.0)
MPV: 10.2 fL (ref 7.5–12.5)
Platelets: 203 10*3/uL (ref 140–400)
RBC: 4.46 10*6/uL (ref 3.80–5.10)
RDW: 12.1 % (ref 11.0–15.0)
WBC: 5 10*3/uL (ref 3.8–10.8)

## 2020-05-26 ENCOUNTER — Ambulatory Visit (INDEPENDENT_AMBULATORY_CARE_PROVIDER_SITE_OTHER): Payer: Medicare Other | Admitting: Osteopathic Medicine

## 2020-05-26 VITALS — BP 182/84 | HR 79

## 2020-05-26 DIAGNOSIS — I10 Essential (primary) hypertension: Secondary | ICD-10-CM | POA: Diagnosis not present

## 2020-05-26 MED ORDER — OLMESARTAN MEDOXOMIL 20 MG PO TABS: 10.0000 mg | ORAL_TABLET | Freq: Every day | ORAL | 0 refills | Status: AC

## 2020-05-26 NOTE — Progress Notes (Signed)
Established Patient Office Visit  Subjective:  Patient ID: Valerie Dixon, female    DOB: 01/10/1946  Age: 74 y.o. MRN: [id number]  CC:  Chief Complaint  Patient presents with   Hypertension    HPI Valerie Dixon presents for blood pressure check. She is not currently taking blood pressure medication. She states she has macular degeneration and the blood pressure medication drys out her eyes.   Past Medical History:  Diagnosis Date   Osteoporosis 03/23/2017   DEXA 03/2017    Past Surgical History:  Procedure Laterality Date   TONSILLECTOMY      Family History  Problem Relation Age of Onset   Kidney Stones Mother    Cancer Father     Social History   Socioeconomic History   Marital status: Married    Spouse name: Not on file   Number of children: Not on file   Years of education: Not on file   Highest education level: Not on file  Occupational History   Not on file  Tobacco Use   Smoking status: Never Smoker   Smokeless tobacco: Never Used  Substance and Sexual Activity   Alcohol use: No   Drug use: No   Sexual activity: Yes    Birth control/protection: None  Other Topics Concern   Not on file  Social History Narrative   Not on file   Social Determinants of Health   Financial Resource Strain:    Difficulty of Paying Living Expenses: Not on file  Food Insecurity:    Worried About [place] in the Last Year: Not on file   [employer] of Food in the Last Year: Not on file  Transportation Needs:    Lack of Transportation (Medical): Not on file   Lack of Transportation (Non-Medical): Not on file  Physical Activity:    Days of Exercise per Week: Not on file   Minutes of Exercise per Session: Not on file  Stress:    Feeling of Stress : Not on file  Social Connections:    Frequency of Communication with Friends and Family: Not on file   Frequency of Social Gatherings with Friends and Family: Not on file    Attends Religious Services: Not on file   Active Member of Clubs or Organizations: Not on file   Attends Archivist Meetings: Not on file   Marital Status: Not on file  Intimate Partner Violence:    Fear of Current or Ex-Partner: Not on file   Emotionally Abused: Not on file   Physically Abused: Not on file   Sexually Abused: Not on file    Outpatient Medications Prior to Visit  Medication Sig Dispense Refill   Calcium-Magnesium-Vitamin D (CALCIUM 500 PO) Take by mouth 2 (two) times daily.     Cholecalciferol (VITAMIN D-3) 1000 UNITS CAPS Take by mouth.     estradiol (ESTRACE) 0.1 MG/GM vaginal cream Place 1 Applicatorful vaginally 3 (three) times a week. 85 g 5   Lutein-Zeaxanthin 15-0.7 MG CAPS Take by mouth.     Magnesium 400 MG CAPS Take by mouth.     Melatonin 10 MG CAPS Take by mouth at bedtime.      No facility-administered medications prior to visit.    Allergies  Allergen Reactions   Celecoxib Swelling    ROS Review of Systems    Objective:    Physical Exam  BP (!) 181/68    Pulse 79    SpO2 100%  Wt Readings from Last 3 Encounters:  05/15/20 105 lb (47.6 kg)  11/06/19 105 lb (47.6 kg)  10/25/19 105 lb (47.6 kg)     Health Maintenance Due  Topic Date Due   TETANUS/TDAP  Never done   Fecal DNA (Cologuard)  03/25/2020    There are no preventive care reminders to display for this patient.  Lab Results  Component Value Date   TSH 1.66 04/16/2019   Lab Results  Component Value Date   WBC 5.0 05/16/2020   HGB 14.9 05/16/2020   HCT 43.4 05/16/2020   MCV 97.3 05/16/2020   PLT 203 05/16/2020   Lab Results  Component Value Date   NA 136 05/16/2020   K 4.9 05/16/2020   CO2 28 05/16/2020   GLUCOSE 82 05/16/2020   BUN 20 05/16/2020   CREATININE 0.73 05/16/2020   BILITOT 1.2 05/16/2020   ALKPHOS 71 03/09/2017   AST 23 05/16/2020   ALT 20 05/16/2020   PROT 6.6 05/16/2020   ALBUMIN 4.5 03/09/2017   CALCIUM 9.7 05/16/2020    Lab Results  Component Value Date   CHOL 245 (H) 03/09/2017   Lab Results  Component Value Date   HDL 105 03/09/2017   Lab Results  Component Value Date   LDLCALC 125 (H) 03/09/2017   Lab Results  Component Value Date   TRIG 75 03/09/2017   Lab Results  Component Value Date   CHOLHDL 2.3 03/09/2017   Lab Results  Component Value Date   HGBA1C 5.2 04/06/2018      Assessment & Plan:  HTN - Elevated blood pressure -    Problem List Items Addressed This Visit    None    Visit Diagnoses    Essential hypertension    -  Primary      No orders of the defined types were placed in this encounter.   Follow-up: No follow-ups on file.    Lavell Luster, [place]

## 2020-05-26 NOTE — Progress Notes (Signed)
BP Readings from Last 3 Encounters:  05/26/20 (!) 182/84  05/15/20 (!) 189/81  11/06/19 138/78  Initiated low-dose ARB, hopefully will not cause dryness issues pt has experienced w/ other Rx, RTC nurse visit in 1 week, pt is on ocular injections which can increase BP, may consider discussing risk/benefit/alternative w/ ophtho

## 2020-06-03 ENCOUNTER — Ambulatory Visit (INDEPENDENT_AMBULATORY_CARE_PROVIDER_SITE_OTHER): Payer: Medicare Other | Admitting: Osteopathic Medicine

## 2020-06-03 DIAGNOSIS — Z1211 Encounter for screening for malignant neoplasm of colon: Secondary | ICD-10-CM | POA: Diagnosis not present

## 2020-06-03 NOTE — Progress Notes (Signed)
Pt in for one week bp check.  First reading was 169/59. 2nd reading was 153/80.  Pt states that her home readings are always normal and this morning it was 113/60.  She stated that she is only taking a half tab of the Olmesartan. She also stated that she hasn't received her Colguard kit in the mail yet.  I didn't see the order in her chart.

## 2020-06-04 NOTE — Progress Notes (Signed)
If BP at home ok <140/90 that's acceptable Cologuard order placed

## 2020-06-16 DIAGNOSIS — Z1211 Encounter for screening for malignant neoplasm of colon: Secondary | ICD-10-CM | POA: Diagnosis not present

## 2020-06-23 DIAGNOSIS — H938X2 Other specified disorders of left ear: Secondary | ICD-10-CM | POA: Diagnosis not present

## 2020-06-25 LAB — COLOGUARD: Cologuard: NEGATIVE

## 2020-07-02 DIAGNOSIS — H353122 Nonexudative age-related macular degeneration, left eye, intermediate dry stage: Secondary | ICD-10-CM | POA: Diagnosis not present

## 2020-07-02 DIAGNOSIS — H25813 Combined forms of age-related cataract, bilateral: Secondary | ICD-10-CM | POA: Diagnosis not present

## 2020-07-02 DIAGNOSIS — H353211 Exudative age-related macular degeneration, right eye, with active choroidal neovascularization: Secondary | ICD-10-CM | POA: Diagnosis not present

## 2020-07-02 DIAGNOSIS — H43813 Vitreous degeneration, bilateral: Secondary | ICD-10-CM | POA: Diagnosis not present

## 2020-07-03 DIAGNOSIS — H838X3 Other specified diseases of inner ear, bilateral: Secondary | ICD-10-CM | POA: Diagnosis not present

## 2020-07-03 DIAGNOSIS — H93293 Other abnormal auditory perceptions, bilateral: Secondary | ICD-10-CM | POA: Diagnosis not present

## 2020-07-03 DIAGNOSIS — H903 Sensorineural hearing loss, bilateral: Secondary | ICD-10-CM | POA: Diagnosis not present

## 2020-07-03 DIAGNOSIS — H9313 Tinnitus, bilateral: Secondary | ICD-10-CM | POA: Diagnosis not present

## 2020-08-20 DIAGNOSIS — H43813 Vitreous degeneration, bilateral: Secondary | ICD-10-CM | POA: Diagnosis not present

## 2020-08-20 DIAGNOSIS — H353122 Nonexudative age-related macular degeneration, left eye, intermediate dry stage: Secondary | ICD-10-CM | POA: Diagnosis not present

## 2020-08-20 DIAGNOSIS — H25813 Combined forms of age-related cataract, bilateral: Secondary | ICD-10-CM | POA: Diagnosis not present

## 2020-08-20 DIAGNOSIS — H353211 Exudative age-related macular degeneration, right eye, with active choroidal neovascularization: Secondary | ICD-10-CM | POA: Diagnosis not present

## 2020-09-09 DIAGNOSIS — H353121 Nonexudative age-related macular degeneration, left eye, early dry stage: Secondary | ICD-10-CM | POA: Diagnosis not present

## 2020-09-09 DIAGNOSIS — H25013 Cortical age-related cataract, bilateral: Secondary | ICD-10-CM | POA: Diagnosis not present

## 2020-09-09 DIAGNOSIS — H353211 Exudative age-related macular degeneration, right eye, with active choroidal neovascularization: Secondary | ICD-10-CM | POA: Diagnosis not present

## 2020-09-09 DIAGNOSIS — H2513 Age-related nuclear cataract, bilateral: Secondary | ICD-10-CM | POA: Diagnosis not present

## 2020-10-08 DIAGNOSIS — H353122 Nonexudative age-related macular degeneration, left eye, intermediate dry stage: Secondary | ICD-10-CM | POA: Diagnosis not present

## 2020-10-08 DIAGNOSIS — H43813 Vitreous degeneration, bilateral: Secondary | ICD-10-CM | POA: Diagnosis not present

## 2020-10-08 DIAGNOSIS — H353211 Exudative age-related macular degeneration, right eye, with active choroidal neovascularization: Secondary | ICD-10-CM | POA: Diagnosis not present

## 2020-10-08 DIAGNOSIS — H25813 Combined forms of age-related cataract, bilateral: Secondary | ICD-10-CM | POA: Diagnosis not present

## 2020-10-31 DIAGNOSIS — U071 COVID-19: Secondary | ICD-10-CM

## 2020-10-31 HISTORY — DX: COVID-19: U07.1

## 2020-11-05 IMAGING — MG MM DIGITAL DIAGNOSTIC BILAT W/ TOMO W/ CAD
6 of 10 series · 6 of 26 positions shown · non-contrast
Comparison: Previous exam(s).

CLINICAL DATA: 73-year-old female presenting for follow-up of
probably benign right breast calcifications.

EXAM:
DIGITAL DIAGNOSTIC BILATERAL MAMMOGRAM WITH CAD AND TOMO

[R CC]
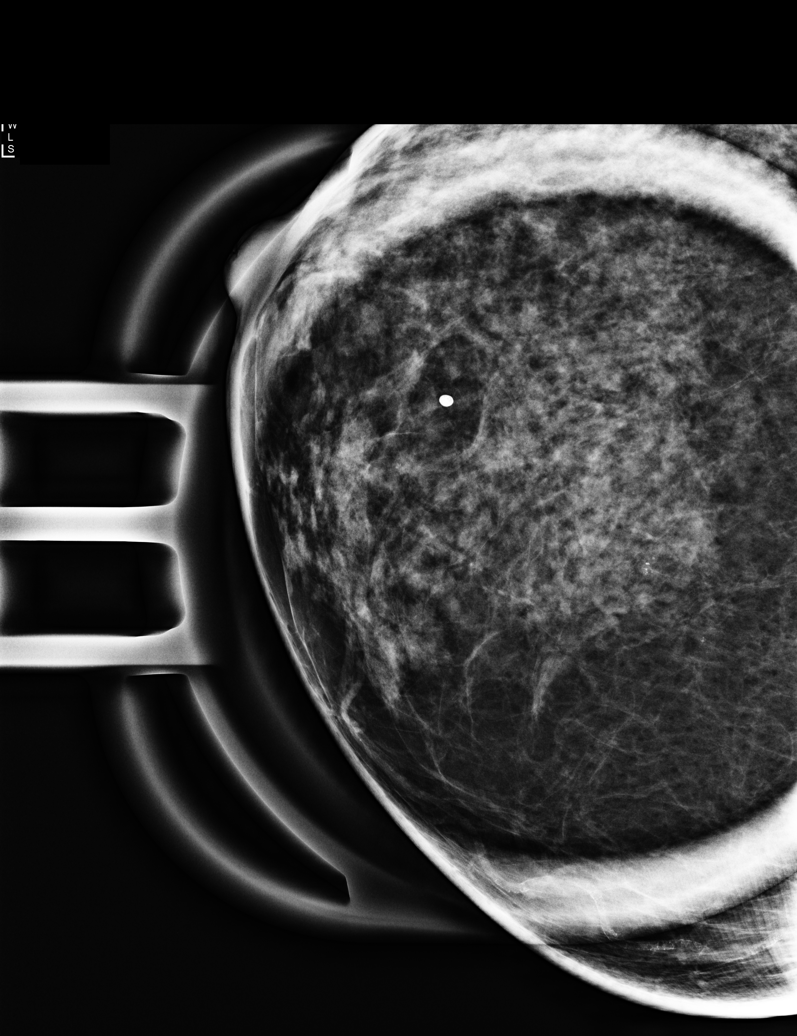

[R ML]
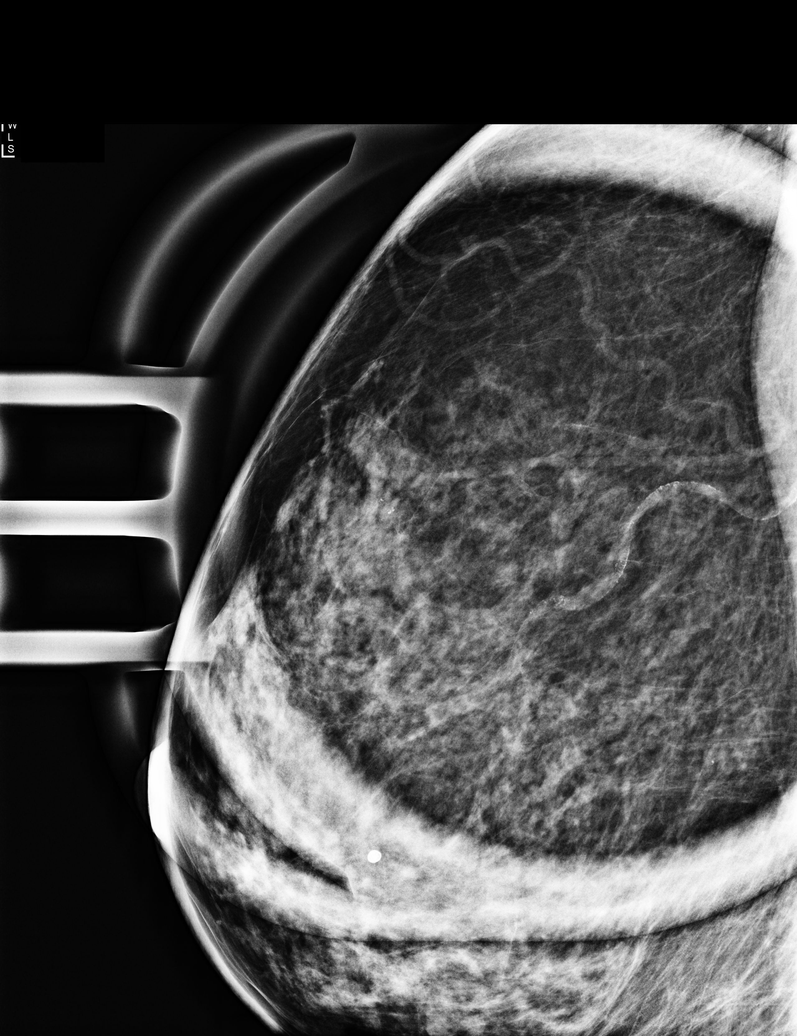

[R MLO synth-2D]
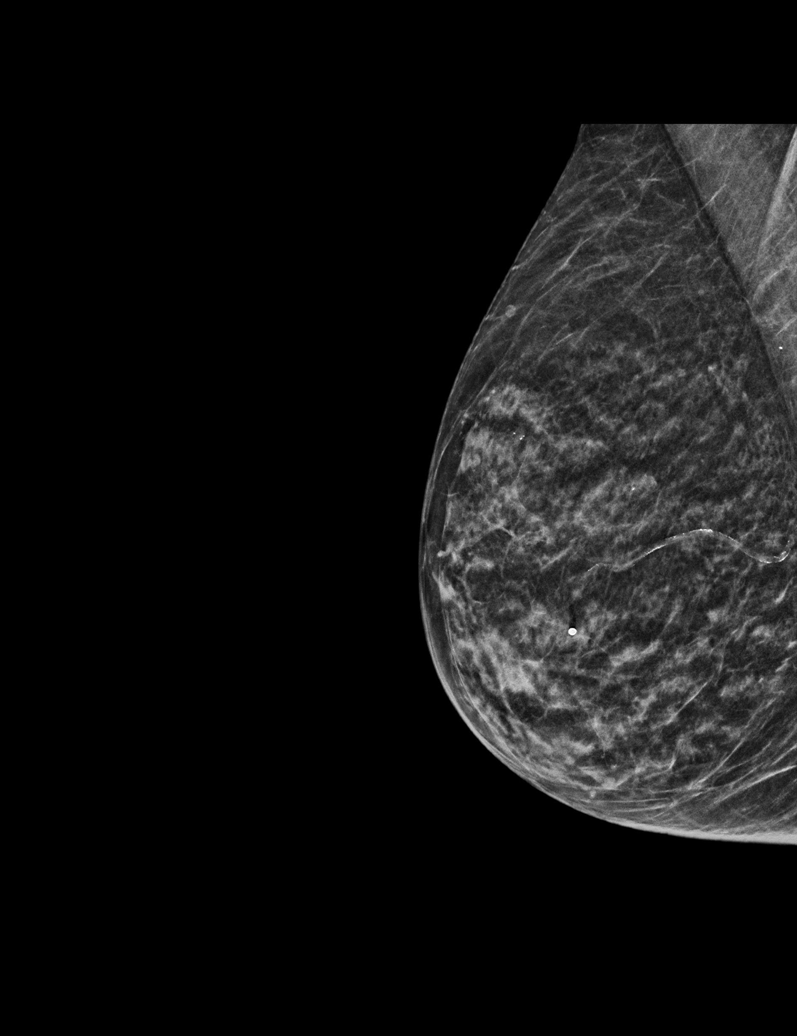

[L CC synth-2D]
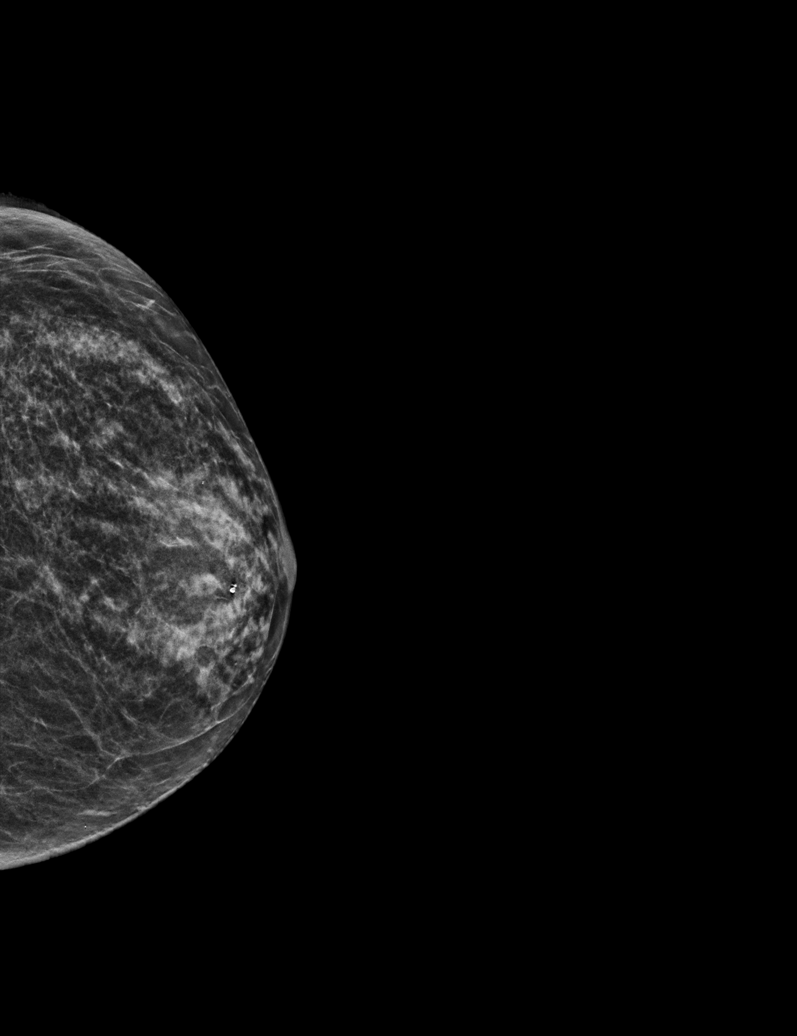

[R CC synth-2D]
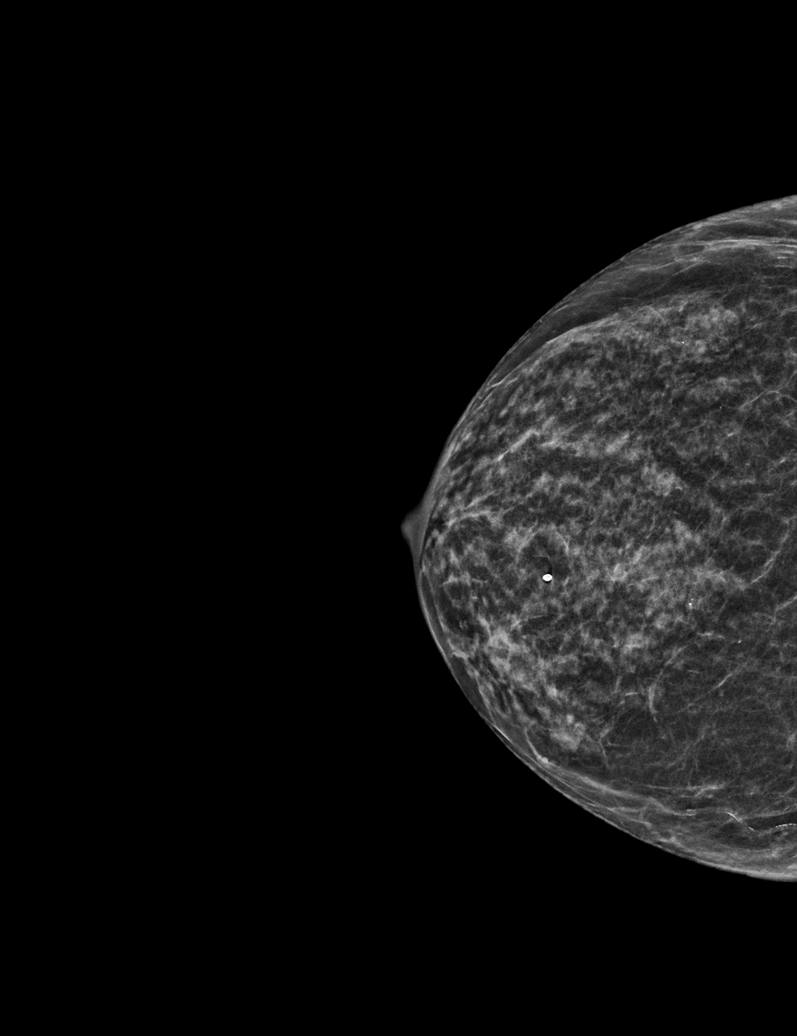

[L MLO synth-2D]
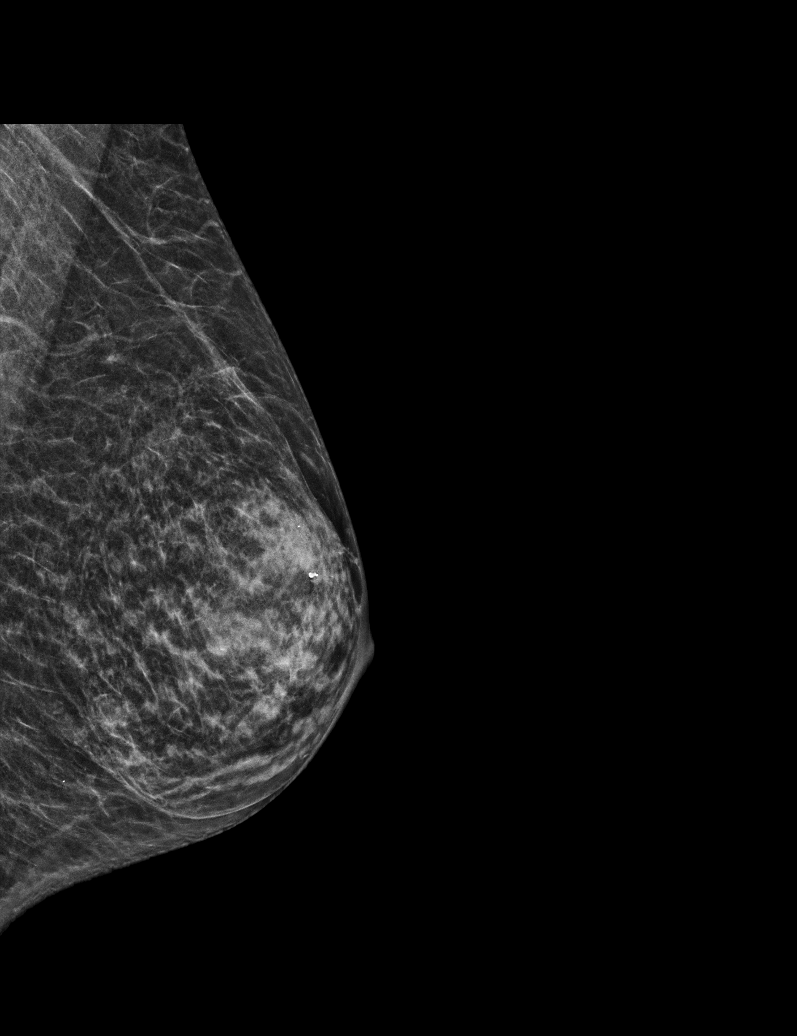

[6 of 26 positions shown; findings below may reference images not displayed]

ACR Breast Density Category c: The breast tissue is heterogeneously
dense, which may obscure small masses.
FINDINGS: The 3 mm group of calcifications in the upper inner right breast are
stable. No suspicious calcifications, masses or areas of distortion
are seen in the bilateral breasts.

Mammographic images were processed with CAD.
IMPRESSION: 1. The 3 mm group of calcifications in the right breast have
demonstrated 2 years of stability and are therefore benign.

2.  No mammographic evidence of malignancy in the bilateral breasts.

RECOMMENDATION:
Screening mammogram in one year.(Code:[id number])

I have discussed the findings and recommendations with the patient.
If applicable, a reminder letter will be sent to the patient
regarding the next appointment.

BI-RADS CATEGORY  2: Benign.

## 2020-11-26 DIAGNOSIS — H353122 Nonexudative age-related macular degeneration, left eye, intermediate dry stage: Secondary | ICD-10-CM | POA: Diagnosis not present

## 2020-11-26 DIAGNOSIS — H353211 Exudative age-related macular degeneration, right eye, with active choroidal neovascularization: Secondary | ICD-10-CM | POA: Diagnosis not present

## 2020-11-26 DIAGNOSIS — H25813 Combined forms of age-related cataract, bilateral: Secondary | ICD-10-CM | POA: Diagnosis not present

## 2020-11-26 DIAGNOSIS — H43813 Vitreous degeneration, bilateral: Secondary | ICD-10-CM | POA: Diagnosis not present

## 2021-01-14 DIAGNOSIS — H353122 Nonexudative age-related macular degeneration, left eye, intermediate dry stage: Secondary | ICD-10-CM | POA: Diagnosis not present

## 2021-01-14 DIAGNOSIS — H25813 Combined forms of age-related cataract, bilateral: Secondary | ICD-10-CM | POA: Diagnosis not present

## 2021-01-14 DIAGNOSIS — H353211 Exudative age-related macular degeneration, right eye, with active choroidal neovascularization: Secondary | ICD-10-CM | POA: Diagnosis not present

## 2021-01-14 DIAGNOSIS — H43813 Vitreous degeneration, bilateral: Secondary | ICD-10-CM | POA: Diagnosis not present

## 2021-02-19 ENCOUNTER — Other Ambulatory Visit: Payer: Self-pay

## 2021-02-19 ENCOUNTER — Emergency Department (INDEPENDENT_AMBULATORY_CARE_PROVIDER_SITE_OTHER)
Admission: EM | Admit: 2021-02-19 | Discharge: 2021-02-19 | Disposition: A | Discharge status: Home / Self Care | Payer: Medicare Other | Source: Home / Self Care

## 2021-02-19 ENCOUNTER — Encounter: Payer: Self-pay | Admitting: Emergency Medicine

## 2021-02-19 DIAGNOSIS — S1096XA Insect bite of unspecified part of neck, initial encounter: Secondary | ICD-10-CM

## 2021-02-19 DIAGNOSIS — W57XXXA Bitten or stung by nonvenomous insect and other nonvenomous arthropods, initial encounter: Secondary | ICD-10-CM

## 2021-02-19 DIAGNOSIS — R21 Rash and other nonspecific skin eruption: Secondary | ICD-10-CM | POA: Diagnosis not present

## 2021-02-19 MED ORDER — DOXYCYCLINE HYCLATE 100 MG PO CAPS: 100.0000 mg | ORAL_CAPSULE | Freq: Two times a day (BID) | ORAL | 0 refills | Status: AC

## 2021-02-19 MED ORDER — FEXOFENADINE HCL 180 MG PO TABS: 180.0000 mg | ORAL_TABLET | Freq: Every day | ORAL | 0 refills | Status: AC

## 2021-02-19 NOTE — ED Triage Notes (Signed)
Red circular rash to base of neck Pt states her husband removed a small tick from that area approx 7 days ago Not a deer tick per pt  COVID vaccine x 2  COVID 4/22

## 2021-02-19 NOTE — Discharge Instructions (Addendum)
Advised patient to take medication (Doxycycline) as directed with food to completion.  Advised patient to take Allegra 180 mg daily for the next 5 days for pruritic rash surrounding tick bite, then as needed.  Encouraged patient to increase daily water intake while taking these medications.

## 2021-02-19 NOTE — ED Provider Notes (Signed)
[facility]    CSN: [id number] Arrival date & time: 02/19/21  1310      History   Chief Complaint Chief Complaint  Patient presents with   Rash    Tick bite     HPI Valerie Dixon is a 75 y.o. female.   HPI 75 year old female presents with a rash of neck evolving for the past 7 days.  Reports that her husband removed small tick from area intact, reports larger tick not deer tick.  Past Medical History:  Diagnosis Date   COVID-19 10/2020   Osteoporosis 03/23/2017   DEXA 03/2017    Patient Active Problem List   Diagnosis Date Noted   Splenic artery aneurysm ([place]) 11/12/2019   Osteoporosis 03/23/2017   Pelvic pressure in female 04/03/2015   Urinary frequency 04/03/2015   Nocturia 04/03/2015   Papanicolaou smear for cervical cancer screening 04/03/2015   White coat hypertension 04/03/2015    Past Surgical History:  Procedure Laterality Date   TONSILLECTOMY      OB History   No obstetric history on file.      Home Medications    Prior to Admission medications   Medication Sig Start Date End Date Taking? Authorizing Provider  doxycycline (VIBRAMYCIN) 100 MG capsule Take 1 capsule (100 mg total) by mouth 2 (two) times daily for 7 days. 02/19/21 02/26/21 Yes Eliezer Lofts, FNP  fexofenadine [facility] ALLERGY) 180 MG tablet Take 1 tablet (180 mg total) by mouth daily for 15 days. 02/19/21 03/06/21 Yes Eliezer Lofts, FNP  Calcium-Magnesium-Vitamin D (CALCIUM 500 PO) Take by mouth 2 (two) times daily.    [provider]  Cholecalciferol (VITAMIN D-3) 1000 UNITS CAPS Take by mouth.    [provider]  estradiol (ESTRACE) 0.1 MG/GM vaginal cream Place 1 Applicatorful vaginally 3 (three) times a week. 05/16/20   Emeterio Reeve, DO  Lutein-Zeaxanthin 15-0.7 MG CAPS Take by mouth.    [provider]  Magnesium 400 MG CAPS Take by mouth.    [provider]  Melatonin 10 MG CAPS Take by mouth at bedtime.     [provider]  olmesartan (BENICAR) 20 MG tablet Take 0.5 tablets (10 mg total) by mouth daily. 05/26/20   Emeterio Reeve, DO    Family History Family History  Problem Relation Age of Onset   Kidney Stones Mother    Cancer Father     Social History Social History   Tobacco Use   Smoking status: Never   Smokeless tobacco: Never  Substance Use Topics   Alcohol use: No   Drug use: No     Allergies   Celecoxib   Review of Systems Review of Systems  Skin:  Positive for rash.    Physical Exam Triage Vital Signs ED Triage Vitals  Enc Vitals Group     BP 02/19/21 1336 (!) 182/88     Pulse Rate 02/19/21 1336 65     Resp 02/19/21 1336 17     Temp 02/19/21 1336 98.4 F (36.9 C)     Temp Source 02/19/21 1336 Oral     SpO2 02/19/21 1336 98 %     Weight --      Height --      Head Circumference --      Peak Flow --      Pain Score 02/19/21 1339 0     Pain Loc --      Pain Edu? --      Excl. in [place]? --  No data found.  Updated Vital Signs BP (!) 182/88 (BP Location: Right Arm) Comment: white coat syndrome per pt  ( 135/55 - norm at home )  Pulse 65   Temp 98.4 F (36.9 C) (Oral)   Resp 17   SpO2 98%      Physical Exam Vitals and nursing note reviewed.  Constitutional:      General: She is not in acute distress.    Appearance: Normal appearance. She is not ill-appearing.  HENT:     Head: Normocephalic and atraumatic.     Mouth/Throat:     Mouth: Mucous membranes are moist.     Pharynx: Oropharynx is clear.  Eyes:     Extraocular Movements: Extraocular movements intact.     Conjunctiva/sclera: Conjunctivae normal.     Pupils: Pupils are equal, round, and reactive to light.  Cardiovascular:     Rate and Rhythm: Normal rate and regular rhythm.     Pulses: Normal pulses.     Heart sounds: Normal heart sounds.  Pulmonary:     Effort: Pulmonary effort is normal.     Breath sounds: Normal breath sounds. No wheezing, rhonchi or rales.   Musculoskeletal:        General: Normal range of motion.     Cervical back: Normal range of motion and neck supple.  Lymphadenopathy:     Cervical: No cervical adenopathy.  Skin:    General: Skin is warm and dry.     Comments: Neck (posterior inferior aspect) 2.0 cm x 2.0 cm circular erythematous plaque/patch, with tiny targetoid erythematous central lesion, pruritic in nature.  Neurological:     General: No focal deficit present.     Mental Status: She is alert and oriented to person, place, and time.  Psychiatric:        Mood and Affect: Mood normal.        Behavior: Behavior normal.        Thought Content: Thought content normal.     UC Treatments / Results  Labs (all labs ordered are listed, but only abnormal results are displayed) Labs Reviewed - No data to display  EKG   Radiology No results found.  Procedures Procedures (including critical care time)  Medications Ordered in UC Medications - No data to display  Initial Impression / Assessment and Plan / UC Course  I have reviewed the triage vital signs and the nursing notes.  Pertinent labs & imaging results that were available during my care of the patient were reviewed by me and considered in my medical decision making (see chart for details).     MDM: 1.  Tick bite of neck initial encounter-Rx'd doxycycline instructed patient to take medication as directed with food to completion. 2.  Rash and nonspecific skin eruption-Rx'd Allegra 180 mg x 5 days, as needed.  Encourage patient to increase daily water intake while taking these medications.  Patient discharged home, hemodynamically stable. Final Clinical Impressions(s) / UC Diagnoses   Final diagnoses:  Rash and nonspecific skin eruption  Tick bite of neck, initial encounter     Discharge Instructions      Advised patient to take medication (Doxycycline) as directed with food to completion.  Advised patient to take Allegra 180 mg daily for the next 5  days for pruritic rash surrounding tick bite, then as needed.  Encouraged patient to increase daily water intake while taking these medications.     ED Prescriptions     Medication Sig Dispense Auth. Provider  doxycycline (VIBRAMYCIN) 100 MG capsule Take 1 capsule (100 mg total) by mouth 2 (two) times daily for 7 days. 14 capsule Eliezer Lofts, FNP   fexofenadine [facility] ALLERGY) 180 MG tablet Take 1 tablet (180 mg total) by mouth daily for 15 days. 15 tablet Eliezer Lofts, FNP      PDMP not reviewed this encounter.   Eliezer Lofts, [place] 02/19/21 1422

## 2021-03-11 DIAGNOSIS — H43813 Vitreous degeneration, bilateral: Secondary | ICD-10-CM | POA: Diagnosis not present

## 2021-03-11 DIAGNOSIS — H353122 Nonexudative age-related macular degeneration, left eye, intermediate dry stage: Secondary | ICD-10-CM | POA: Diagnosis not present

## 2021-03-11 DIAGNOSIS — H353211 Exudative age-related macular degeneration, right eye, with active choroidal neovascularization: Secondary | ICD-10-CM | POA: Diagnosis not present

## 2021-03-11 DIAGNOSIS — H25813 Combined forms of age-related cataract, bilateral: Secondary | ICD-10-CM | POA: Diagnosis not present

## 2021-05-13 DIAGNOSIS — H25813 Combined forms of age-related cataract, bilateral: Secondary | ICD-10-CM | POA: Diagnosis not present

## 2021-05-13 DIAGNOSIS — H353122 Nonexudative age-related macular degeneration, left eye, intermediate dry stage: Secondary | ICD-10-CM | POA: Diagnosis not present

## 2021-05-13 DIAGNOSIS — H353211 Exudative age-related macular degeneration, right eye, with active choroidal neovascularization: Secondary | ICD-10-CM | POA: Diagnosis not present

## 2021-05-13 DIAGNOSIS — H43813 Vitreous degeneration, bilateral: Secondary | ICD-10-CM | POA: Diagnosis not present

## 2021-05-18 ENCOUNTER — Encounter: Payer: Self-pay | Admitting: Medical-Surgical

## 2021-05-18 ENCOUNTER — Ambulatory Visit (INDEPENDENT_AMBULATORY_CARE_PROVIDER_SITE_OTHER): Payer: Medicare Other | Admitting: Medical-Surgical

## 2021-05-18 VITALS — BP 194/65 | HR 72 | Resp 20 | Ht 61.0 in | Wt 106.5 lb

## 2021-05-18 DIAGNOSIS — Z23 Encounter for immunization: Secondary | ICD-10-CM | POA: Diagnosis not present

## 2021-05-18 DIAGNOSIS — Z0000 Encounter for general adult medical examination without abnormal findings: Secondary | ICD-10-CM | POA: Diagnosis not present

## 2021-05-18 DIAGNOSIS — M818 Other osteoporosis without current pathological fracture: Secondary | ICD-10-CM | POA: Diagnosis not present

## 2021-05-18 DIAGNOSIS — R030 Elevated blood-pressure reading, without diagnosis of hypertension: Secondary | ICD-10-CM | POA: Diagnosis not present

## 2021-05-18 DIAGNOSIS — M810 Age-related osteoporosis without current pathological fracture: Secondary | ICD-10-CM

## 2021-05-18 MED ORDER — SHINGRIX 50 MCG/0.5ML IM SUSR: 0.5000 mL | Freq: Once | INTRAMUSCULAR | 1 refills | Status: AC

## 2021-05-18 MED ORDER — TETANUS-DIPHTH-ACELL PERTUSSIS 5-2.5-18.5 LF-MCG/0.5 IM SUSY: 0.5000 mL | PREFILLED_SYRINGE | Freq: Once | INTRAMUSCULAR | 0 refills | Status: AC

## 2021-05-18 NOTE — Progress Notes (Signed)
Subjective:   Valerie Dixon is a 75 y.o. female who presents for Medicare Annual (Subsequent) preventive examination.  Review of Systems    Denies health concerns or concerning symptoms today. Lives at home with her husband. No children and no siblings. Does have a couple of cousins that are close like siblings. Likes to read. Has not done a lot of traveling or outdoor activities since [place]. Cardiac Risk Factors include: advanced age (>71men, >81 women)     Objective:    Today's Vitals   05/18/21 1329  BP: (!) 194/65  Pulse: 72  Resp: 20  SpO2: 95%  Weight: 106 lb 8 oz (48.3 kg)  Height: 5\' 1"  (1.549 m)   Body mass index is 20.12 kg/m.  Advanced Directives 04/03/2015  Does Patient Have a Medical Advance Directive? No  Would patient like information on creating a medical advance directive? No - patient declined information    Current Medications (verified) Outpatient Encounter Medications as of 05/18/2021  Medication Sig   Calcium-Magnesium-Vitamin D (CALCIUM 500 PO) Take by mouth 2 (two) times daily.   Cholecalciferol (VITAMIN D-3) 1000 UNITS CAPS Take by mouth.   estradiol (ESTRACE) 0.1 MG/GM vaginal cream Place 1 Applicatorful vaginally 3 (three) times a week.   Lutein-Zeaxanthin 15-0.7 MG CAPS Take by mouth.   Magnesium 400 MG CAPS Take by mouth.   olmesartan (BENICAR) 20 MG tablet Take 0.5 tablets (10 mg total) by mouth daily.   Tdap (BOOSTRIX) 5-2.5-18.5 LF-MCG/0.5 injection Inject 0.5 mLs into the muscle once for 1 dose. Administer at pharmacy   Zoster Vaccine Adjuvanted [facility]) injection Inject 0.5 mLs into the muscle once for 1 dose. Repeat in 2 to 6 months   fexofenadine (ALLEGRA ALLERGY) 180 MG tablet Take 1 tablet (180 mg total) by mouth daily for 15 days.   [DISCONTINUED] Melatonin 10 MG CAPS Take by mouth at bedtime.    No facility-administered encounter medications on file as of 05/18/2021.    Allergies (verified) Celecoxib   History: Past Medical  History:  Diagnosis Date   COVID-19 10/2020   Osteoporosis 03/23/2017   DEXA 03/2017   Past Surgical History:  Procedure Laterality Date   TONSILLECTOMY     Family History  Problem Relation Age of Onset   Kidney Stones Mother    Cancer Father    Social History   Socioeconomic History   Marital status: Married    Spouse name: Not on file   Number of children: Not on file   Years of education: Not on file   Highest education level: Not on file  Occupational History   Not on file  Tobacco Use   Smoking status: Never   Smokeless tobacco: Never  Substance and Sexual Activity   Alcohol use: No   Drug use: No   Sexual activity: Yes    Birth control/protection: None  Other Topics Concern   Not on file  Social History Narrative   Not on file   Social Determinants of Health   Financial Resource Strain: Not on file  Food Insecurity: Not on file  Transportation Needs: Not on file  Physical Activity: Not on file  Stress: Not on file  Social Connections: Not on file    Tobacco Counseling Counseling given: Not Answered   Clinical Intake:     Pain : No/denies pain     Nutritional Status: BMI of 19-24  Normal Nutritional Risks: None Diabetes: No  How often do you need to have someone help you  when you read instructions, pamphlets, or other written materials from your doctor or pharmacy?: 1 - Never  Diabetic? No  Interpreter Needed?: No      Activities of Daily Living In your present state of health, do you have any difficulty performing the following activities: 05/18/2021  Hearing? N  Vision? N  Difficulty concentrating or making decisions? N  Walking or climbing stairs? N  Dressing or bathing? N  Doing errands, shopping? N  Preparing Food and eating ? N  Using the Toilet? N  In the past six months, have you accidently leaked urine? N  Do you have problems with loss of bowel control? N  Managing your Medications? N  Managing your Finances? N   Housekeeping or managing your Housekeeping? N  Some recent data might be hidden    Patient Care Team: Emeterio Reeve, DO as PCP - General (Osteopathic Medicine)  Indicate any recent Medical Services you may have received from other than [facility] providers in the past year (date may be approximate).     Assessment:   This is a routine wellness examination for Valerie Dixon.  Hearing/Vision screen No results found.  Dietary issues and exercise activities discussed: Current Exercise Habits: Home exercise routine, Type of exercise: walking, Exercise limited by: None identified   Goals Addressed   None    Depression Screen PHQ 2/9 Scores 05/18/2021 05/15/2020 10/25/2019 04/16/2019 04/06/2018 04/06/2017  PHQ - 2 Score 0 0 0 0 0 0  PHQ- 9 Score - 1 - - 2 -    Fall Risk Fall Risk  05/18/2021 05/15/2020 10/25/2019 04/06/2017  Falls in the past year? 0 0 0 No  Number falls in past yr: 0 0 0 -  Injury with Fall? 0 0 0 -  Risk for fall due to : - - No Fall Risks -  Follow up Falls evaluation completed Falls evaluation completed - -    FALL RISK PREVENTION PERTAINING TO THE HOME:  Any stairs in or around the home? Yes  If so, are there any without handrails? Yes  Home free of loose throw rugs in walkways, pet beds, electrical cords, etc? No  Adequate lighting in your home to reduce risk of falls? Yes   ASSISTIVE DEVICES UTILIZED TO PREVENT FALLS:  Life alert? No  Use of a cane, walker or w/c? No  Grab bars in the bathroom? Yes  Shower chair or bench in shower? No  Elevated toilet seat or a handicapped toilet? Yes   TIMED UP AND GO:  Was the test performed? No .  Gait steady and fast without use of assistive device  Cognitive Function:     6CIT Screen 05/18/2021 05/15/2020  What Year? 0 points 0 points  What month? 0 points 0 points  What time? 0 points 0 points  Count back from 20 0 points 0 points  Months in reverse 0 points 0 points  Repeat phrase 0 points 0 points  Total  Score 0 0    Immunizations Immunization History  Administered Date(s) Administered   Fluad Quad(high Dose 65+) 04/12/2019, 05/15/2020, 05/18/2021   Influenza-Unspecified 05/24/2018, 04/12/2019   Moderna Sars-Covid-2 Vaccination 10/03/2019, 10/31/2019   Pneumococcal Polysaccharide-23 04/06/2017    TDAP status: Due, Education has been provided regarding the importance of this vaccine. Advised may receive this vaccine at local pharmacy or Health Dept. Aware to provide a copy of the vaccination record if obtained from local pharmacy or Health Dept. Verbalized acceptance and understanding.  Flu Vaccine status: Completed at  today's visit  Pneumococcal vaccine status: Up to date  Covid-19 vaccine status: Completed vaccines  Qualifies for Shingles Vaccine? Yes   Zostavax completed Yes   Shingrix Completed?: No.    Education has been provided regarding the importance of this vaccine. Patient has been advised to call insurance company to determine out of pocket expense if they have not yet received this vaccine. Advised may also receive vaccine at local pharmacy or Health Dept. Verbalized acceptance and understanding.  Screening Tests Health Maintenance  Topic Date Due   TETANUS/TDAP  Never done   Zoster Vaccines- Shingrix (1 of 2) Never done   COVID-19 Vaccine (3 - Booster for Moderna series) 04/01/2020   MAMMOGRAM  05/10/2021   Fecal DNA (Cologuard)  06/17/2023   INFLUENZA VACCINE  Completed   DEXA SCAN  Completed   Hepatitis C Screening  Completed   HPV VACCINES  Aged Out    Health Maintenance  Health Maintenance Due  Topic Date Due   TETANUS/TDAP  Never done   Zoster Vaccines- Shingrix (1 of 2) Never done   COVID-19 Vaccine (3 - Booster for Moderna series) 04/01/2020   MAMMOGRAM  05/10/2021    Colorectal cancer screening: Type of screening: Cologuard. Completed 2021. Repeat every 3 years  Mammogram status: Ordered today. Pt provided with contact info and advised to call to  schedule appt.   Bone Density status: Ordered today. Pt provided with contact info and advised to call to schedule appt.  Lung Cancer Screening: (Low Dose CT Chest recommended if Age 56-80 years, 30 pack-year currently smoking OR have quit w/in 15years.) does not qualify.   Lung Cancer Screening Referral: NA  Additional Screening:  Hepatitis C Screening: does not qualify  Vision Screening: Recommended annual ophthalmology exams for early detection of glaucoma and other disorders of the eye. Is the patient up to date with their annual eye exam?  Yes  Who is the provider or what is the name of the office in which the patient attends annual eye exams? Dr. Cordelia Pen If pt is not established with a provider, would they like to be referred to a provider to establish care? No .   Dental Screening: Recommended annual dental exams for proper oral hygiene  Community Resource Referral / Chronic Care Management: CRR required this visit?  No   CCM required this visit?  No      Plan:     Ms. Woolworth , Thank you for taking time to come for your Medicare Wellness Visit. I appreciate your ongoing commitment to your health goals. Please review the following plan we discussed and let me know if I can assist you in the future.   This is a list of the screening recommended for you and due dates:  Health Maintenance  Topic Date Due   Tetanus Vaccine  Never done   Zoster (Shingles) Vaccine (1 of 2) Never done   COVID-19 Vaccine (3 - Booster for Moderna series) 04/01/2020   Mammogram  05/10/2021   Cologuard (Stool DNA test)  06/17/2023   Flu Shot  Completed   DEXA scan (bone density measurement)  Completed   Hepatitis C Screening: USPSTF Recommendation to screen - Ages 22-79 yo.  Completed   HPV Vaccine  Aged Out   I have personally reviewed and noted the following in the patient's chart:   Medical and social history Use of alcohol, tobacco or illicit drugs  Current medications and supplements  including opioid prescriptions.  Functional ability and status Nutritional  status Physical activity Advanced directives List of other physicians Hospitalizations, surgeries, and ER visits in previous 12 months Vitals Screenings to include cognitive, depression, and falls Referrals and appointments  In addition, I have reviewed and discussed with patient certain preventive protocols, quality metrics, and best practice recommendations. A written personalized care plan for preventive services as well as general preventive health recommendations were provided to patient.   Clearnce Sorrel, DNP, APRN, FNP-BC [facility]

## 2021-05-19 LAB — COMPLETE METABOLIC PANEL WITH GFR
AG Ratio: 2 (calc) (ref 1.0–2.5)
ALT: 21 U/L (ref 6–29)
AST: 25 U/L (ref 10–35)
Albumin: 4.5 g/dL (ref 3.6–5.1)
Alkaline phosphatase (APISO): 64 U/L (ref 37–153)
BUN: 18 mg/dL (ref 7–25)
CO2: 28 mmol/L (ref 20–32)
Calcium: 9.7 mg/dL (ref 8.6–10.4)
Chloride: 102 mmol/L (ref 98–110)
Creat: 0.62 mg/dL (ref 0.60–1.00)
Globulin: 2.2 g/dL (calc) (ref 1.9–3.7)
Glucose, Bld: 90 mg/dL (ref 65–99)
Potassium: 4.4 mmol/L (ref 3.5–5.3)
Sodium: 139 mmol/L (ref 135–146)
Total Bilirubin: 0.6 mg/dL (ref 0.2–1.2)
Total Protein: 6.7 g/dL (ref 6.1–8.1)
eGFR: 94 mL/min/{1.73_m2} (ref >=60)

## 2021-05-19 LAB — CBC WITH DIFFERENTIAL/PLATELET
Absolute Monocytes: 507 cells/uL (ref 200–950)
Basophils Absolute: 52 cells/uL (ref 0–200)
Basophils Relative: 1.2 %
Eosinophils Absolute: 52 cells/uL (ref 15–500)
Eosinophils Relative: 1.2 %
HCT: 43.7 % (ref 35.0–45.0)
Hemoglobin: 14.9 g/dL (ref 11.7–15.5)
Lymphs Abs: 1195 cells/uL (ref 850–3900)
MCH: 32.8 pg (ref 27.0–33.0)
MCHC: 34.1 g/dL (ref 32.0–36.0)
MCV: 96.3 fL (ref 80.0–100.0)
MPV: 10.4 fL (ref 7.5–12.5)
Monocytes Relative: 11.8 %
Neutro Abs: 2494 cells/uL (ref 1500–7800)
Neutrophils Relative %: 58 %
Platelets: 222 10*3/uL (ref 140–400)
RBC: 4.54 10*6/uL (ref 3.80–5.10)
RDW: 12 % (ref 11.0–15.0)
Total Lymphocyte: 27.8 %
WBC: 4.3 10*3/uL (ref 3.8–10.8)

## 2021-05-19 LAB — VITAMIN D 25 HYDROXY (VIT D DEFICIENCY, FRACTURES): Vit D, 25-Hydroxy: 65 ng/mL (ref 30–100)

## 2021-05-19 LAB — TSH: TSH: 2.23 mIU/L (ref 0.40–4.50)

## 2021-07-01 DIAGNOSIS — H25813 Combined forms of age-related cataract, bilateral: Secondary | ICD-10-CM | POA: Diagnosis not present

## 2021-07-01 DIAGNOSIS — H353211 Exudative age-related macular degeneration, right eye, with active choroidal neovascularization: Secondary | ICD-10-CM | POA: Diagnosis not present

## 2021-07-01 DIAGNOSIS — H353122 Nonexudative age-related macular degeneration, left eye, intermediate dry stage: Secondary | ICD-10-CM | POA: Diagnosis not present

## 2021-07-01 DIAGNOSIS — H43813 Vitreous degeneration, bilateral: Secondary | ICD-10-CM | POA: Diagnosis not present

## 2021-08-19 DIAGNOSIS — H25813 Combined forms of age-related cataract, bilateral: Secondary | ICD-10-CM | POA: Diagnosis not present

## 2021-08-19 DIAGNOSIS — H353122 Nonexudative age-related macular degeneration, left eye, intermediate dry stage: Secondary | ICD-10-CM | POA: Diagnosis not present

## 2021-08-19 DIAGNOSIS — H43813 Vitreous degeneration, bilateral: Secondary | ICD-10-CM | POA: Diagnosis not present

## 2021-08-19 DIAGNOSIS — H353211 Exudative age-related macular degeneration, right eye, with active choroidal neovascularization: Secondary | ICD-10-CM | POA: Diagnosis not present

## 2021-09-30 DIAGNOSIS — H5203 Hypermetropia, bilateral: Secondary | ICD-10-CM | POA: Diagnosis not present

## 2021-09-30 DIAGNOSIS — H2513 Age-related nuclear cataract, bilateral: Secondary | ICD-10-CM | POA: Diagnosis not present

## 2021-09-30 DIAGNOSIS — H353121 Nonexudative age-related macular degeneration, left eye, early dry stage: Secondary | ICD-10-CM | POA: Diagnosis not present

## 2021-09-30 DIAGNOSIS — H353211 Exudative age-related macular degeneration, right eye, with active choroidal neovascularization: Secondary | ICD-10-CM | POA: Diagnosis not present

## 2021-10-07 DIAGNOSIS — H25813 Combined forms of age-related cataract, bilateral: Secondary | ICD-10-CM | POA: Diagnosis not present

## 2021-10-07 DIAGNOSIS — H353211 Exudative age-related macular degeneration, right eye, with active choroidal neovascularization: Secondary | ICD-10-CM | POA: Diagnosis not present

## 2021-10-07 DIAGNOSIS — H353122 Nonexudative age-related macular degeneration, left eye, intermediate dry stage: Secondary | ICD-10-CM | POA: Diagnosis not present

## 2021-10-26 DIAGNOSIS — H353211 Exudative age-related macular degeneration, right eye, with active choroidal neovascularization: Secondary | ICD-10-CM | POA: Diagnosis not present

## 2021-10-26 DIAGNOSIS — H353122 Nonexudative age-related macular degeneration, left eye, intermediate dry stage: Secondary | ICD-10-CM | POA: Diagnosis not present

## 2021-10-26 DIAGNOSIS — I10 Essential (primary) hypertension: Secondary | ICD-10-CM | POA: Diagnosis not present

## 2021-10-26 DIAGNOSIS — Z0000 Encounter for general adult medical examination without abnormal findings: Secondary | ICD-10-CM | POA: Diagnosis not present

## 2021-11-16 DIAGNOSIS — Z0000 Encounter for general adult medical examination without abnormal findings: Secondary | ICD-10-CM | POA: Diagnosis not present

## 2021-11-16 DIAGNOSIS — R011 Cardiac murmur, unspecified: Secondary | ICD-10-CM | POA: Diagnosis not present

## 2021-11-16 DIAGNOSIS — H353211 Exudative age-related macular degeneration, right eye, with active choroidal neovascularization: Secondary | ICD-10-CM | POA: Diagnosis not present

## 2021-11-16 DIAGNOSIS — Z23 Encounter for immunization: Secondary | ICD-10-CM | POA: Diagnosis not present

## 2021-11-16 DIAGNOSIS — Z0001 Encounter for general adult medical examination with abnormal findings: Secondary | ICD-10-CM | POA: Diagnosis not present

## 2021-11-16 DIAGNOSIS — I10 Essential (primary) hypertension: Secondary | ICD-10-CM | POA: Diagnosis not present

## 2021-11-16 DIAGNOSIS — H9312 Tinnitus, left ear: Secondary | ICD-10-CM | POA: Diagnosis not present

## 2021-11-16 DIAGNOSIS — H353122 Nonexudative age-related macular degeneration, left eye, intermediate dry stage: Secondary | ICD-10-CM | POA: Diagnosis not present

## 2021-11-16 DIAGNOSIS — I728 Aneurysm of other specified arteries: Secondary | ICD-10-CM | POA: Diagnosis not present

## 2021-11-25 DIAGNOSIS — H25813 Combined forms of age-related cataract, bilateral: Secondary | ICD-10-CM | POA: Diagnosis not present

## 2021-11-25 DIAGNOSIS — H353231 Exudative age-related macular degeneration, bilateral, with active choroidal neovascularization: Secondary | ICD-10-CM | POA: Diagnosis not present

## 2021-11-25 DIAGNOSIS — Z79899 Other long term (current) drug therapy: Secondary | ICD-10-CM | POA: Diagnosis not present

## 2021-11-25 DIAGNOSIS — H353211 Exudative age-related macular degeneration, right eye, with active choroidal neovascularization: Secondary | ICD-10-CM | POA: Diagnosis not present

## 2021-11-25 DIAGNOSIS — Z886 Allergy status to analgesic agent status: Secondary | ICD-10-CM | POA: Diagnosis not present

## 2021-11-25 DIAGNOSIS — H353122 Nonexudative age-related macular degeneration, left eye, intermediate dry stage: Secondary | ICD-10-CM | POA: Diagnosis not present

## 2021-12-08 DIAGNOSIS — N281 Cyst of kidney, acquired: Secondary | ICD-10-CM | POA: Diagnosis not present

## 2021-12-08 DIAGNOSIS — I728 Aneurysm of other specified arteries: Secondary | ICD-10-CM | POA: Diagnosis not present

## 2021-12-18 DIAGNOSIS — I10 Essential (primary) hypertension: Secondary | ICD-10-CM | POA: Diagnosis not present

## 2021-12-18 DIAGNOSIS — J309 Allergic rhinitis, unspecified: Secondary | ICD-10-CM | POA: Diagnosis not present

## 2021-12-18 DIAGNOSIS — H9312 Tinnitus, left ear: Secondary | ICD-10-CM | POA: Diagnosis not present

## 2021-12-18 DIAGNOSIS — R011 Cardiac murmur, unspecified: Secondary | ICD-10-CM | POA: Diagnosis not present

## 2022-01-11 DIAGNOSIS — Z0000 Encounter for general adult medical examination without abnormal findings: Secondary | ICD-10-CM | POA: Diagnosis not present

## 2022-01-11 DIAGNOSIS — H9312 Tinnitus, left ear: Secondary | ICD-10-CM | POA: Diagnosis not present

## 2022-01-11 DIAGNOSIS — H353211 Exudative age-related macular degeneration, right eye, with active choroidal neovascularization: Secondary | ICD-10-CM | POA: Diagnosis not present

## 2022-01-11 DIAGNOSIS — I10 Essential (primary) hypertension: Secondary | ICD-10-CM | POA: Diagnosis not present

## 2022-01-13 DIAGNOSIS — H353122 Nonexudative age-related macular degeneration, left eye, intermediate dry stage: Secondary | ICD-10-CM | POA: Diagnosis not present

## 2022-01-13 DIAGNOSIS — H353211 Exudative age-related macular degeneration, right eye, with active choroidal neovascularization: Secondary | ICD-10-CM | POA: Diagnosis not present

## 2022-01-13 DIAGNOSIS — H25813 Combined forms of age-related cataract, bilateral: Secondary | ICD-10-CM | POA: Diagnosis not present

## 2022-01-20 DIAGNOSIS — H6121 Impacted cerumen, right ear: Secondary | ICD-10-CM | POA: Diagnosis not present

## 2022-01-20 DIAGNOSIS — H903 Sensorineural hearing loss, bilateral: Secondary | ICD-10-CM | POA: Diagnosis not present

## 2022-01-20 DIAGNOSIS — H838X3 Other specified diseases of inner ear, bilateral: Secondary | ICD-10-CM | POA: Diagnosis not present

## 2022-02-17 DIAGNOSIS — Z1231 Encounter for screening mammogram for malignant neoplasm of breast: Secondary | ICD-10-CM | POA: Diagnosis not present

## 2022-03-03 DIAGNOSIS — H25813 Combined forms of age-related cataract, bilateral: Secondary | ICD-10-CM | POA: Diagnosis not present

## 2022-03-03 DIAGNOSIS — H353122 Nonexudative age-related macular degeneration, left eye, intermediate dry stage: Secondary | ICD-10-CM | POA: Diagnosis not present

## 2022-03-03 DIAGNOSIS — H353211 Exudative age-related macular degeneration, right eye, with active choroidal neovascularization: Secondary | ICD-10-CM | POA: Diagnosis not present

## 2022-03-03 DIAGNOSIS — Z79620 Long term (current) use of immunosuppressive biologic: Secondary | ICD-10-CM | POA: Diagnosis not present

## 2022-03-17 DIAGNOSIS — R011 Cardiac murmur, unspecified: Secondary | ICD-10-CM | POA: Diagnosis not present

## 2022-03-17 DIAGNOSIS — E559 Vitamin D deficiency, unspecified: Secondary | ICD-10-CM | POA: Diagnosis not present

## 2022-03-17 DIAGNOSIS — F419 Anxiety disorder, unspecified: Secondary | ICD-10-CM | POA: Diagnosis not present

## 2022-03-17 DIAGNOSIS — K5792 Diverticulitis of intestine, part unspecified, without perforation or abscess without bleeding: Secondary | ICD-10-CM | POA: Diagnosis not present

## 2022-03-17 DIAGNOSIS — I10 Essential (primary) hypertension: Secondary | ICD-10-CM | POA: Diagnosis not present

## 2022-03-17 DIAGNOSIS — H353211 Exudative age-related macular degeneration, right eye, with active choroidal neovascularization: Secondary | ICD-10-CM | POA: Diagnosis not present

## 2022-03-17 DIAGNOSIS — Z79899 Other long term (current) drug therapy: Secondary | ICD-10-CM | POA: Diagnosis not present

## 2022-04-21 DIAGNOSIS — H353211 Exudative age-related macular degeneration, right eye, with active choroidal neovascularization: Secondary | ICD-10-CM | POA: Diagnosis not present

## 2022-04-21 DIAGNOSIS — H25813 Combined forms of age-related cataract, bilateral: Secondary | ICD-10-CM | POA: Diagnosis not present

## 2022-04-21 DIAGNOSIS — H353122 Nonexudative age-related macular degeneration, left eye, intermediate dry stage: Secondary | ICD-10-CM | POA: Diagnosis not present

## 2022-06-01 DIAGNOSIS — Z23 Encounter for immunization: Secondary | ICD-10-CM | POA: Diagnosis not present

## 2022-06-01 DIAGNOSIS — Z79899 Other long term (current) drug therapy: Secondary | ICD-10-CM | POA: Diagnosis not present

## 2022-06-01 DIAGNOSIS — K5792 Diverticulitis of intestine, part unspecified, without perforation or abscess without bleeding: Secondary | ICD-10-CM | POA: Diagnosis not present

## 2022-06-01 DIAGNOSIS — I10 Essential (primary) hypertension: Secondary | ICD-10-CM | POA: Diagnosis not present

## 2022-06-01 DIAGNOSIS — R011 Cardiac murmur, unspecified: Secondary | ICD-10-CM | POA: Diagnosis not present

## 2022-06-01 DIAGNOSIS — Z0000 Encounter for general adult medical examination without abnormal findings: Secondary | ICD-10-CM | POA: Diagnosis not present

## 2022-06-16 DIAGNOSIS — H25813 Combined forms of age-related cataract, bilateral: Secondary | ICD-10-CM | POA: Diagnosis not present

## 2022-06-16 DIAGNOSIS — H353211 Exudative age-related macular degeneration, right eye, with active choroidal neovascularization: Secondary | ICD-10-CM | POA: Diagnosis not present

## 2022-06-16 DIAGNOSIS — H353122 Nonexudative age-related macular degeneration, left eye, intermediate dry stage: Secondary | ICD-10-CM | POA: Diagnosis not present

## 2022-06-24 DIAGNOSIS — Z886 Allergy status to analgesic agent status: Secondary | ICD-10-CM | POA: Diagnosis not present

## 2022-06-24 DIAGNOSIS — Z79899 Other long term (current) drug therapy: Secondary | ICD-10-CM | POA: Diagnosis not present

## 2022-06-24 DIAGNOSIS — J984 Other disorders of lung: Secondary | ICD-10-CM | POA: Diagnosis not present

## 2022-06-24 DIAGNOSIS — R9431 Abnormal electrocardiogram [ECG] [EKG]: Secondary | ICD-10-CM | POA: Diagnosis not present

## 2022-06-24 DIAGNOSIS — J9811 Atelectasis: Secondary | ICD-10-CM | POA: Diagnosis not present

## 2022-06-24 DIAGNOSIS — I10 Essential (primary) hypertension: Secondary | ICD-10-CM | POA: Diagnosis not present

## 2022-06-24 DIAGNOSIS — R0602 Shortness of breath: Secondary | ICD-10-CM | POA: Diagnosis not present

## 2022-06-24 DIAGNOSIS — J432 Centrilobular emphysema: Secondary | ICD-10-CM | POA: Diagnosis not present

## 2022-06-28 DIAGNOSIS — R931 Abnormal findings on diagnostic imaging of heart and coronary circulation: Secondary | ICD-10-CM | POA: Diagnosis not present

## 2022-07-01 DIAGNOSIS — R1013 Epigastric pain: Secondary | ICD-10-CM | POA: Diagnosis not present

## 2022-07-01 DIAGNOSIS — K219 Gastro-esophageal reflux disease without esophagitis: Secondary | ICD-10-CM | POA: Diagnosis not present

## 2022-07-01 DIAGNOSIS — R140 Abdominal distension (gaseous): Secondary | ICD-10-CM | POA: Diagnosis not present

## 2022-08-03 DIAGNOSIS — K3189 Other diseases of stomach and duodenum: Secondary | ICD-10-CM | POA: Diagnosis not present

## 2022-08-03 DIAGNOSIS — K219 Gastro-esophageal reflux disease without esophagitis: Secondary | ICD-10-CM | POA: Diagnosis not present

## 2022-08-03 DIAGNOSIS — R1013 Epigastric pain: Secondary | ICD-10-CM | POA: Diagnosis not present

## 2022-08-03 DIAGNOSIS — K2289 Other specified disease of esophagus: Secondary | ICD-10-CM | POA: Diagnosis not present

## 2022-08-11 DIAGNOSIS — H25813 Combined forms of age-related cataract, bilateral: Secondary | ICD-10-CM | POA: Diagnosis not present

## 2022-08-11 DIAGNOSIS — H353122 Nonexudative age-related macular degeneration, left eye, intermediate dry stage: Secondary | ICD-10-CM | POA: Diagnosis not present

## 2022-08-11 DIAGNOSIS — H35721 Serous detachment of retinal pigment epithelium, right eye: Secondary | ICD-10-CM | POA: Diagnosis not present

## 2022-08-11 DIAGNOSIS — H35362 Drusen (degenerative) of macula, left eye: Secondary | ICD-10-CM | POA: Diagnosis not present

## 2022-08-11 DIAGNOSIS — Z886 Allergy status to analgesic agent status: Secondary | ICD-10-CM | POA: Diagnosis not present

## 2022-08-11 DIAGNOSIS — H353211 Exudative age-related macular degeneration, right eye, with active choroidal neovascularization: Secondary | ICD-10-CM | POA: Diagnosis not present

## 2022-09-21 DIAGNOSIS — D225 Melanocytic nevi of trunk: Secondary | ICD-10-CM | POA: Diagnosis not present

## 2022-10-04 DIAGNOSIS — H353121 Nonexudative age-related macular degeneration, left eye, early dry stage: Secondary | ICD-10-CM | POA: Diagnosis not present

## 2022-10-04 DIAGNOSIS — H353211 Exudative age-related macular degeneration, right eye, with active choroidal neovascularization: Secondary | ICD-10-CM | POA: Diagnosis not present

## 2022-10-04 DIAGNOSIS — H2513 Age-related nuclear cataract, bilateral: Secondary | ICD-10-CM | POA: Diagnosis not present

## 2022-10-04 DIAGNOSIS — H25013 Cortical age-related cataract, bilateral: Secondary | ICD-10-CM | POA: Diagnosis not present

## 2022-10-13 DIAGNOSIS — H353122 Nonexudative age-related macular degeneration, left eye, intermediate dry stage: Secondary | ICD-10-CM | POA: Diagnosis not present

## 2022-10-13 DIAGNOSIS — H353211 Exudative age-related macular degeneration, right eye, with active choroidal neovascularization: Secondary | ICD-10-CM | POA: Diagnosis not present

## 2022-10-13 DIAGNOSIS — H25813 Combined forms of age-related cataract, bilateral: Secondary | ICD-10-CM | POA: Diagnosis not present

## 2022-11-01 DIAGNOSIS — K219 Gastro-esophageal reflux disease without esophagitis: Secondary | ICD-10-CM | POA: Diagnosis not present

## 2022-11-25 DIAGNOSIS — L299 Pruritus, unspecified: Secondary | ICD-10-CM | POA: Diagnosis not present

## 2022-11-25 DIAGNOSIS — L300 Nummular dermatitis: Secondary | ICD-10-CM | POA: Diagnosis not present

## 2022-11-25 DIAGNOSIS — D0359 Melanoma in situ of other part of trunk: Secondary | ICD-10-CM | POA: Diagnosis not present

## 2022-12-08 DIAGNOSIS — H353122 Nonexudative age-related macular degeneration, left eye, intermediate dry stage: Secondary | ICD-10-CM | POA: Diagnosis not present

## 2022-12-08 DIAGNOSIS — H353211 Exudative age-related macular degeneration, right eye, with active choroidal neovascularization: Secondary | ICD-10-CM | POA: Diagnosis not present

## 2022-12-08 DIAGNOSIS — H25813 Combined forms of age-related cataract, bilateral: Secondary | ICD-10-CM | POA: Diagnosis not present

## 2022-12-22 DIAGNOSIS — Z79899 Other long term (current) drug therapy: Secondary | ICD-10-CM | POA: Diagnosis not present

## 2022-12-22 DIAGNOSIS — L650 Telogen effluvium: Secondary | ICD-10-CM | POA: Diagnosis not present

## 2023-01-26 DIAGNOSIS — H353122 Nonexudative age-related macular degeneration, left eye, intermediate dry stage: Secondary | ICD-10-CM | POA: Diagnosis not present

## 2023-01-26 DIAGNOSIS — H25813 Combined forms of age-related cataract, bilateral: Secondary | ICD-10-CM | POA: Diagnosis not present

## 2023-01-26 DIAGNOSIS — H353211 Exudative age-related macular degeneration, right eye, with active choroidal neovascularization: Secondary | ICD-10-CM | POA: Diagnosis not present

## 2023-03-02 DIAGNOSIS — D225 Melanocytic nevi of trunk: Secondary | ICD-10-CM | POA: Diagnosis not present

## 2023-03-02 DIAGNOSIS — L300 Nummular dermatitis: Secondary | ICD-10-CM | POA: Diagnosis not present

## 2023-03-22 DIAGNOSIS — H353211 Exudative age-related macular degeneration, right eye, with active choroidal neovascularization: Secondary | ICD-10-CM | POA: Diagnosis not present

## 2023-03-22 DIAGNOSIS — H25813 Combined forms of age-related cataract, bilateral: Secondary | ICD-10-CM | POA: Diagnosis not present

## 2023-03-22 DIAGNOSIS — H353122 Nonexudative age-related macular degeneration, left eye, intermediate dry stage: Secondary | ICD-10-CM | POA: Diagnosis not present

## 2023-04-01 DIAGNOSIS — Z79899 Other long term (current) drug therapy: Secondary | ICD-10-CM | POA: Diagnosis not present

## 2023-04-05 DIAGNOSIS — Z01419 Encounter for gynecological examination (general) (routine) without abnormal findings: Secondary | ICD-10-CM | POA: Diagnosis not present

## 2023-04-05 DIAGNOSIS — R300 Dysuria: Secondary | ICD-10-CM | POA: Diagnosis not present

## 2023-04-05 DIAGNOSIS — M810 Age-related osteoporosis without current pathological fracture: Secondary | ICD-10-CM | POA: Diagnosis not present

## 2023-04-05 DIAGNOSIS — Z1231 Encounter for screening mammogram for malignant neoplasm of breast: Secondary | ICD-10-CM | POA: Diagnosis not present

## 2023-04-05 DIAGNOSIS — Z13820 Encounter for screening for osteoporosis: Secondary | ICD-10-CM | POA: Diagnosis not present

## 2023-04-06 DIAGNOSIS — H25013 Cortical age-related cataract, bilateral: Secondary | ICD-10-CM | POA: Diagnosis not present

## 2023-04-06 DIAGNOSIS — H353211 Exudative age-related macular degeneration, right eye, with active choroidal neovascularization: Secondary | ICD-10-CM | POA: Diagnosis not present

## 2023-04-06 DIAGNOSIS — H2513 Age-related nuclear cataract, bilateral: Secondary | ICD-10-CM | POA: Diagnosis not present

## 2023-04-06 DIAGNOSIS — H353121 Nonexudative age-related macular degeneration, left eye, early dry stage: Secondary | ICD-10-CM | POA: Diagnosis not present

## 2023-04-19 DIAGNOSIS — R1900 Intra-abdominal and pelvic swelling, mass and lump, unspecified site: Secondary | ICD-10-CM | POA: Diagnosis not present

## 2023-04-19 DIAGNOSIS — N859 Noninflammatory disorder of uterus, unspecified: Secondary | ICD-10-CM | POA: Diagnosis not present

## 2023-04-19 DIAGNOSIS — R9389 Abnormal findings on diagnostic imaging of other specified body structures: Secondary | ICD-10-CM | POA: Diagnosis not present

## 2023-04-26 DIAGNOSIS — Z1231 Encounter for screening mammogram for malignant neoplasm of breast: Secondary | ICD-10-CM | POA: Diagnosis not present

## 2023-04-26 DIAGNOSIS — M810 Age-related osteoporosis without current pathological fracture: Secondary | ICD-10-CM | POA: Diagnosis not present

## 2023-04-26 DIAGNOSIS — Z13820 Encounter for screening for osteoporosis: Secondary | ICD-10-CM | POA: Diagnosis not present

## 2023-04-26 DIAGNOSIS — Z780 Asymptomatic menopausal state: Secondary | ICD-10-CM | POA: Diagnosis not present

## 2023-05-02 DIAGNOSIS — R9389 Abnormal findings on diagnostic imaging of other specified body structures: Secondary | ICD-10-CM | POA: Diagnosis not present

## 2023-05-08 DIAGNOSIS — R102 Pelvic and perineal pain: Secondary | ICD-10-CM | POA: Diagnosis not present

## 2023-05-08 DIAGNOSIS — N939 Abnormal uterine and vaginal bleeding, unspecified: Secondary | ICD-10-CM | POA: Diagnosis not present

## 2023-05-08 DIAGNOSIS — Z9889 Other specified postprocedural states: Secondary | ICD-10-CM | POA: Diagnosis not present

## 2023-05-08 DIAGNOSIS — G8929 Other chronic pain: Secondary | ICD-10-CM | POA: Diagnosis not present

## 2023-05-08 DIAGNOSIS — R319 Hematuria, unspecified: Secondary | ICD-10-CM | POA: Diagnosis not present

## 2023-05-16 DIAGNOSIS — R339 Retention of urine, unspecified: Secondary | ICD-10-CM | POA: Diagnosis not present

## 2023-05-16 DIAGNOSIS — R102 Pelvic and perineal pain: Secondary | ICD-10-CM | POA: Diagnosis not present

## 2023-05-18 DIAGNOSIS — H353122 Nonexudative age-related macular degeneration, left eye, intermediate dry stage: Secondary | ICD-10-CM | POA: Diagnosis not present

## 2023-05-18 DIAGNOSIS — H25813 Combined forms of age-related cataract, bilateral: Secondary | ICD-10-CM | POA: Diagnosis not present

## 2023-05-18 DIAGNOSIS — H353211 Exudative age-related macular degeneration, right eye, with active choroidal neovascularization: Secondary | ICD-10-CM | POA: Diagnosis not present

## 2023-06-01 DIAGNOSIS — N3582 Other urethral stricture, female: Secondary | ICD-10-CM | POA: Diagnosis not present

## 2023-06-01 DIAGNOSIS — N761 Subacute and chronic vaginitis: Secondary | ICD-10-CM | POA: Diagnosis not present

## 2023-06-01 DIAGNOSIS — D84821 Immunodeficiency due to drugs: Secondary | ICD-10-CM | POA: Diagnosis not present

## 2023-06-01 DIAGNOSIS — Z79899 Other long term (current) drug therapy: Secondary | ICD-10-CM | POA: Diagnosis not present

## 2023-06-01 DIAGNOSIS — Z1330 Encounter for screening examination for mental health and behavioral disorders, unspecified: Secondary | ICD-10-CM | POA: Diagnosis not present

## 2023-06-01 DIAGNOSIS — R300 Dysuria: Secondary | ICD-10-CM | POA: Diagnosis not present

## 2023-06-02 DIAGNOSIS — Z23 Encounter for immunization: Secondary | ICD-10-CM | POA: Diagnosis not present

## 2023-07-04 DIAGNOSIS — I10 Essential (primary) hypertension: Secondary | ICD-10-CM | POA: Diagnosis not present

## 2023-07-04 DIAGNOSIS — H353211 Exudative age-related macular degeneration, right eye, with active choroidal neovascularization: Secondary | ICD-10-CM | POA: Diagnosis not present

## 2023-07-04 DIAGNOSIS — I70209 Unspecified atherosclerosis of native arteries of extremities, unspecified extremity: Secondary | ICD-10-CM | POA: Diagnosis not present

## 2023-07-04 DIAGNOSIS — Z0001 Encounter for general adult medical examination with abnormal findings: Secondary | ICD-10-CM | POA: Diagnosis not present

## 2023-07-04 DIAGNOSIS — I728 Aneurysm of other specified arteries: Secondary | ICD-10-CM | POA: Diagnosis not present

## 2023-07-04 DIAGNOSIS — I272 Pulmonary hypertension, unspecified: Secondary | ICD-10-CM | POA: Diagnosis not present

## 2023-07-04 DIAGNOSIS — H353122 Nonexudative age-related macular degeneration, left eye, intermediate dry stage: Secondary | ICD-10-CM | POA: Diagnosis not present

## 2023-07-04 DIAGNOSIS — I509 Heart failure, unspecified: Secondary | ICD-10-CM | POA: Diagnosis not present

## 2023-07-04 DIAGNOSIS — E46 Unspecified protein-calorie malnutrition: Secondary | ICD-10-CM | POA: Diagnosis not present

## 2023-07-04 DIAGNOSIS — Z79899 Other long term (current) drug therapy: Secondary | ICD-10-CM | POA: Diagnosis not present

## 2023-07-04 DIAGNOSIS — N94819 Vulvodynia, unspecified: Secondary | ICD-10-CM | POA: Diagnosis not present

## 2023-07-13 DIAGNOSIS — H353211 Exudative age-related macular degeneration, right eye, with active choroidal neovascularization: Secondary | ICD-10-CM | POA: Diagnosis not present

## 2023-07-13 DIAGNOSIS — H353122 Nonexudative age-related macular degeneration, left eye, intermediate dry stage: Secondary | ICD-10-CM | POA: Diagnosis not present

## 2023-07-13 DIAGNOSIS — H25813 Combined forms of age-related cataract, bilateral: Secondary | ICD-10-CM | POA: Diagnosis not present
